# Patient Record
Sex: Female | Born: 1935 | Race: White | Hispanic: No | Smoking: Former smoker
Health system: Southern US, Community
[De-identification: ages and names within clinical notes are randomized; demographics above are authoritative.]

## PROBLEM LIST (undated history)

## (undated) DIAGNOSIS — K439 Ventral hernia without obstruction or gangrene: Secondary | ICD-10-CM

## (undated) DIAGNOSIS — Z860100 Personal history of colon polyps, unspecified: Secondary | ICD-10-CM

## (undated) DIAGNOSIS — I10 Essential (primary) hypertension: Secondary | ICD-10-CM

## (undated) DIAGNOSIS — Z87891 Personal history of nicotine dependence: Secondary | ICD-10-CM

## (undated) DIAGNOSIS — Z1211 Encounter for screening for malignant neoplasm of colon: Secondary | ICD-10-CM

## (undated) DIAGNOSIS — Z853 Personal history of malignant neoplasm of breast: Secondary | ICD-10-CM

## (undated) DIAGNOSIS — C801 Malignant (primary) neoplasm, unspecified: Secondary | ICD-10-CM

## (undated) DIAGNOSIS — Z1239 Encounter for other screening for malignant neoplasm of breast: Secondary | ICD-10-CM

## (undated) DIAGNOSIS — C50419 Malignant neoplasm of upper-outer quadrant of unspecified female breast: Secondary | ICD-10-CM

## (undated) HISTORY — DX: Encounter for other screening for malignant neoplasm of breast: Z12.39

## (undated) HISTORY — DX: Personal history of colonic polyps: Z86.010

## (undated) HISTORY — DX: Personal history of colon polyps, unspecified: Z86.0100

## (undated) HISTORY — DX: Malignant neoplasm of upper-outer quadrant of unspecified female breast: C50.419

## (undated) HISTORY — DX: Personal history of nicotine dependence: Z87.891

## (undated) HISTORY — DX: Malignant (primary) neoplasm, unspecified: C80.1

## (undated) HISTORY — PX: CHOLECYSTECTOMY: SHX55

## (undated) HISTORY — DX: Ventral hernia without obstruction or gangrene: K43.9

## (undated) HISTORY — PX: ABDOMINAL HYSTERECTOMY: SHX81

## (undated) HISTORY — DX: Essential (primary) hypertension: I10

## (undated) HISTORY — DX: Encounter for screening for malignant neoplasm of colon: Z12.11

## (undated) HISTORY — DX: Personal history of malignant neoplasm of breast: Z85.3

## (undated) HISTORY — PX: COLONOSCOPY: SHX174

---

## 1998-02-24 DIAGNOSIS — Z853 Personal history of malignant neoplasm of breast: Secondary | ICD-10-CM

## 1998-02-24 HISTORY — PX: BREAST LUMPECTOMY: SHX2

## 1998-02-24 HISTORY — DX: Personal history of malignant neoplasm of breast: Z85.3

## 1999-01-25 DIAGNOSIS — C801 Malignant (primary) neoplasm, unspecified: Secondary | ICD-10-CM

## 1999-01-25 HISTORY — DX: Malignant (primary) neoplasm, unspecified: C80.1

## 2001-02-24 HISTORY — PX: UPPER GI ENDOSCOPY: SHX6162

## 2001-05-06 DIAGNOSIS — Z860100 Personal history of colon polyps, unspecified: Secondary | ICD-10-CM | POA: Insufficient documentation

## 2004-02-25 HISTORY — PX: OTHER SURGICAL HISTORY: SHX169

## 2004-07-20 ENCOUNTER — Inpatient Hospital Stay: Payer: Self-pay | Admitting: Orthopaedic Surgery

## 2004-07-20 ENCOUNTER — Other Ambulatory Visit: Payer: Self-pay

## 2006-02-24 HISTORY — PX: SKIN CANCER EXCISION: SHX779

## 2006-06-04 ENCOUNTER — Ambulatory Visit: Payer: Self-pay | Admitting: General Surgery

## 2007-02-25 HISTORY — PX: PELVIC EXENTERATION: SHX738

## 2007-02-25 HISTORY — PX: ABDOMINAL AORTIC ANEURYSM REPAIR: SHX42

## 2007-02-25 HISTORY — PX: WRIST SURGERY: SHX841

## 2007-08-09 ENCOUNTER — Other Ambulatory Visit: Payer: Self-pay

## 2007-08-09 ENCOUNTER — Ambulatory Visit: Payer: Self-pay | Admitting: Obstetrics & Gynecology

## 2007-08-19 ENCOUNTER — Ambulatory Visit: Payer: Self-pay | Admitting: Obstetrics & Gynecology

## 2007-11-26 ENCOUNTER — Inpatient Hospital Stay: Payer: Self-pay | Admitting: Vascular Surgery

## 2007-11-26 ENCOUNTER — Other Ambulatory Visit: Payer: Self-pay

## 2007-12-03 ENCOUNTER — Other Ambulatory Visit: Payer: Self-pay

## 2008-02-16 ENCOUNTER — Emergency Department: Payer: Self-pay | Admitting: Internal Medicine

## 2008-02-23 ENCOUNTER — Emergency Department: Payer: Self-pay | Admitting: Emergency Medicine

## 2008-02-24 ENCOUNTER — Inpatient Hospital Stay: Payer: Self-pay | Admitting: Specialist

## 2008-02-25 DIAGNOSIS — K439 Ventral hernia without obstruction or gangrene: Secondary | ICD-10-CM

## 2008-02-25 HISTORY — DX: Ventral hernia without obstruction or gangrene: K43.9

## 2008-02-25 HISTORY — PX: VENTRAL HERNIA REPAIR: SHX424

## 2008-02-28 ENCOUNTER — Encounter: Payer: Self-pay | Admitting: Internal Medicine

## 2008-03-27 ENCOUNTER — Encounter: Payer: Self-pay | Admitting: Internal Medicine

## 2008-04-26 ENCOUNTER — Ambulatory Visit: Payer: Self-pay | Admitting: Orthopedic Surgery

## 2008-05-11 ENCOUNTER — Ambulatory Visit: Payer: Self-pay | Admitting: Family Medicine

## 2008-07-11 ENCOUNTER — Ambulatory Visit: Payer: Self-pay | Admitting: General Surgery

## 2008-07-19 ENCOUNTER — Inpatient Hospital Stay: Payer: Self-pay | Admitting: General Surgery

## 2008-07-19 DIAGNOSIS — K439 Ventral hernia without obstruction or gangrene: Secondary | ICD-10-CM | POA: Insufficient documentation

## 2008-07-19 HISTORY — PX: OOPHORECTOMY: SHX86

## 2008-10-17 ENCOUNTER — Ambulatory Visit: Payer: Self-pay | Admitting: Family Medicine

## 2008-10-25 ENCOUNTER — Ambulatory Visit: Payer: Self-pay | Admitting: Family Medicine

## 2008-10-25 ENCOUNTER — Ambulatory Visit: Payer: Self-pay | Admitting: Internal Medicine

## 2008-11-08 ENCOUNTER — Ambulatory Visit: Payer: Self-pay | Admitting: Family Medicine

## 2008-11-13 ENCOUNTER — Ambulatory Visit: Payer: Self-pay | Admitting: Internal Medicine

## 2008-11-16 ENCOUNTER — Ambulatory Visit: Payer: Self-pay | Admitting: Internal Medicine

## 2008-11-24 ENCOUNTER — Ambulatory Visit: Payer: Self-pay | Admitting: Internal Medicine

## 2009-02-24 ENCOUNTER — Ambulatory Visit: Payer: Self-pay | Admitting: Internal Medicine

## 2009-02-26 ENCOUNTER — Ambulatory Visit: Payer: Self-pay | Admitting: Internal Medicine

## 2009-03-02 ENCOUNTER — Ambulatory Visit: Payer: Self-pay | Admitting: Internal Medicine

## 2009-03-08 ENCOUNTER — Ambulatory Visit: Payer: Self-pay | Admitting: Family Medicine

## 2009-03-19 ENCOUNTER — Ambulatory Visit: Payer: Self-pay | Admitting: Family Medicine

## 2009-03-27 ENCOUNTER — Ambulatory Visit: Payer: Self-pay | Admitting: Internal Medicine

## 2009-10-02 ENCOUNTER — Ambulatory Visit: Payer: Self-pay | Admitting: Family Medicine

## 2009-10-11 ENCOUNTER — Emergency Department: Payer: Self-pay | Admitting: Internal Medicine

## 2009-10-18 ENCOUNTER — Ambulatory Visit: Payer: Self-pay

## 2009-10-25 ENCOUNTER — Ambulatory Visit: Payer: Self-pay | Admitting: Unknown Physician Specialty

## 2009-11-02 ENCOUNTER — Ambulatory Visit: Payer: Self-pay | Admitting: Unknown Physician Specialty

## 2009-11-06 LAB — PATHOLOGY REPORT

## 2009-12-11 IMAGING — CT CT ABD-PELV W/ CM
1 of 2 series · 15 of 32 positions shown, 19 images · non-contrast
Comparison: none

REASON FOR EXAM: abd mass found on exam
COMMENTS:

[Series 2: soft tissue · axial · 0.72mm/px · z∈[+86,+466]mm · 15 of 84 slices shown, 19 images]
[im 4/84  soft-tissue]
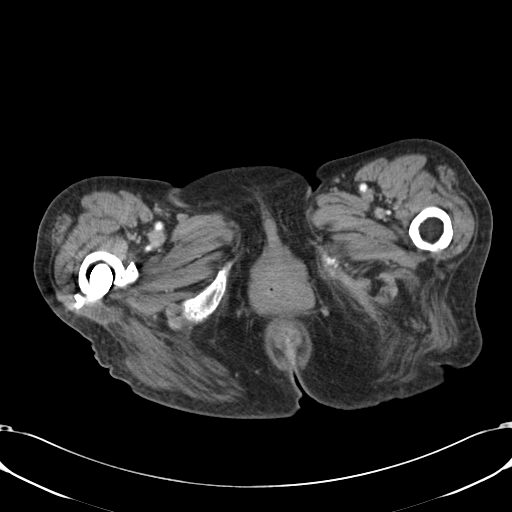
[im 4/84  bone]
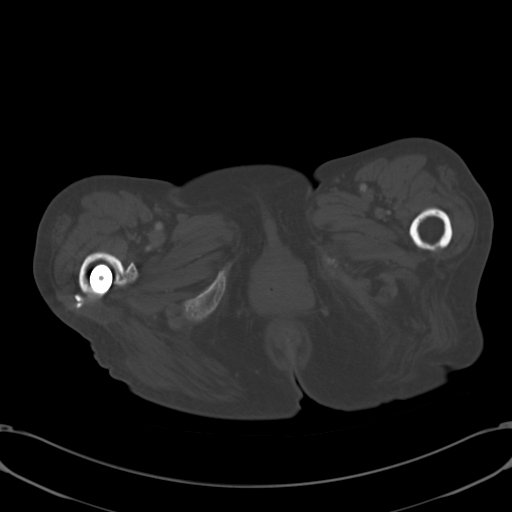
[im 10/84  soft-tissue]
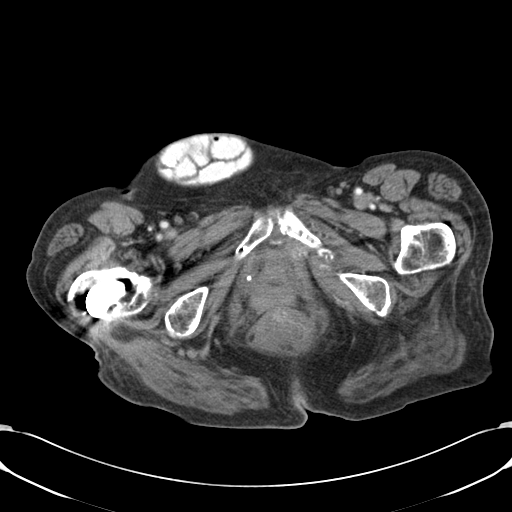
[im 16/84  soft-tissue]
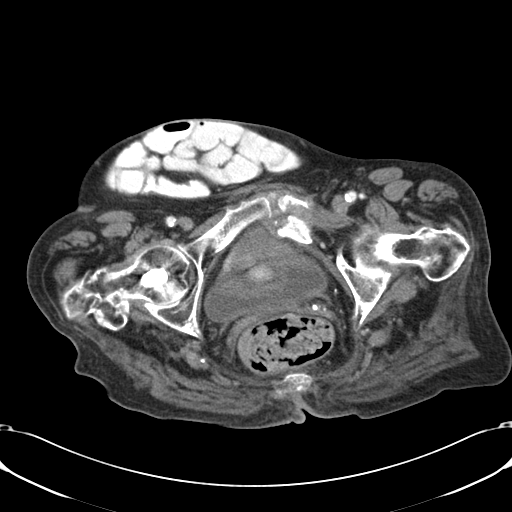
[im 23/84  soft-tissue]
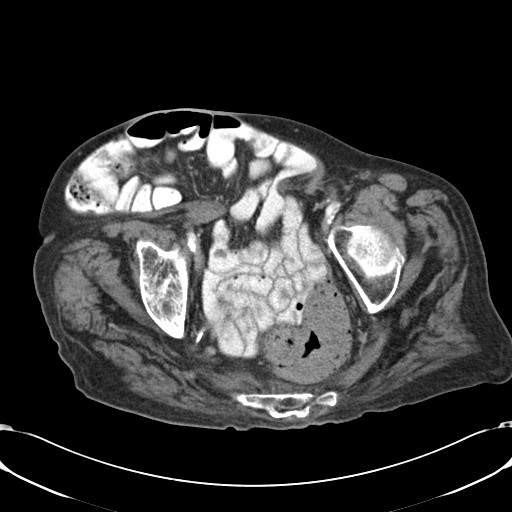
[im 29/84  soft-tissue]
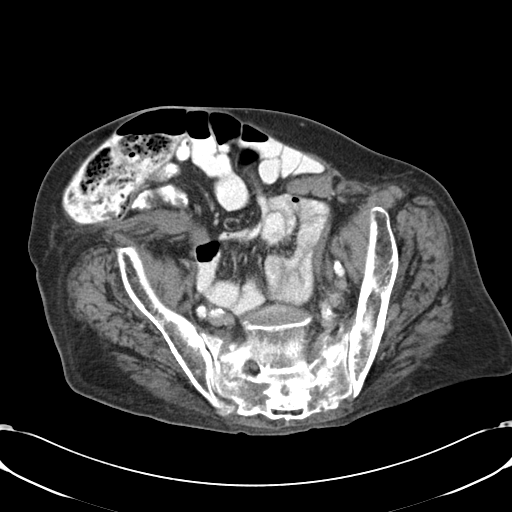
[im 36/84  soft-tissue]
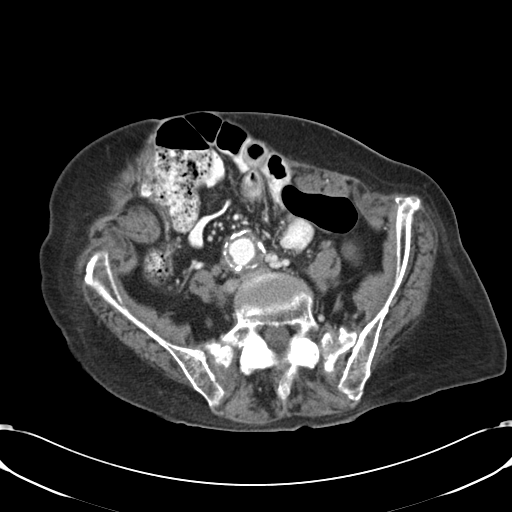
[im 42/84  soft-tissue]
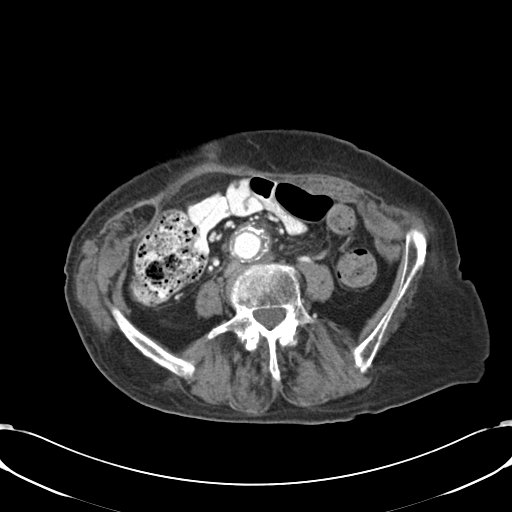
[im 48/84  soft-tissue]
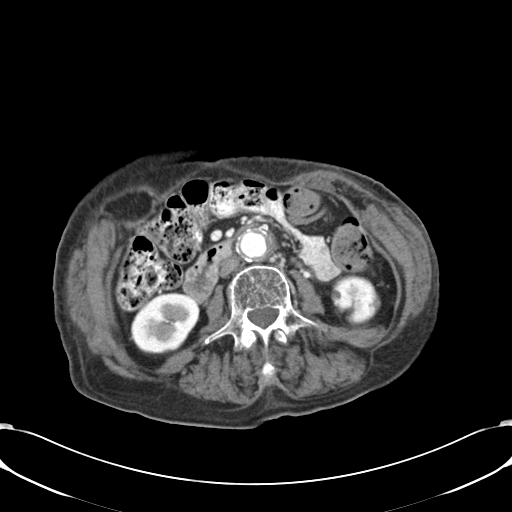
[im 55/84  soft-tissue]
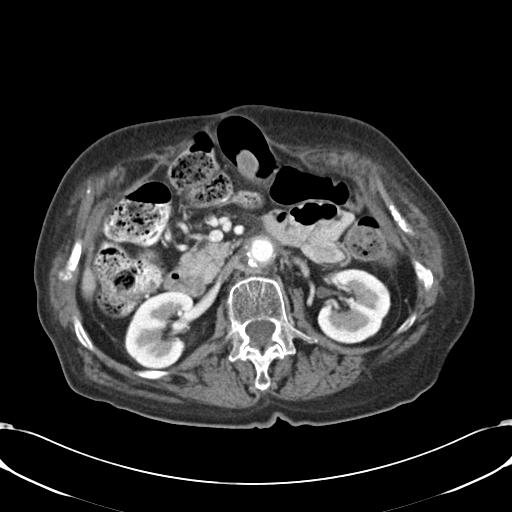
[im 55/84  bone]
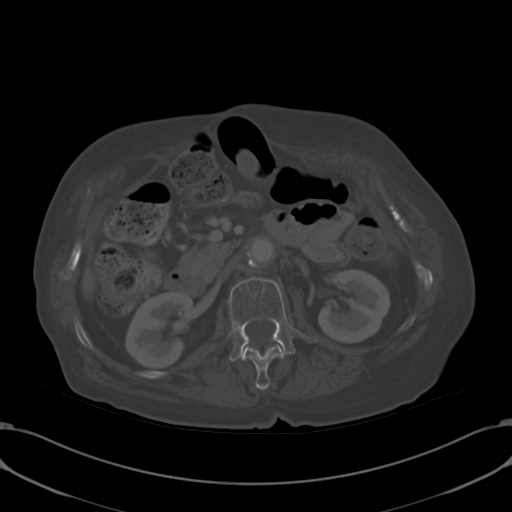
[im 61/84  soft-tissue]
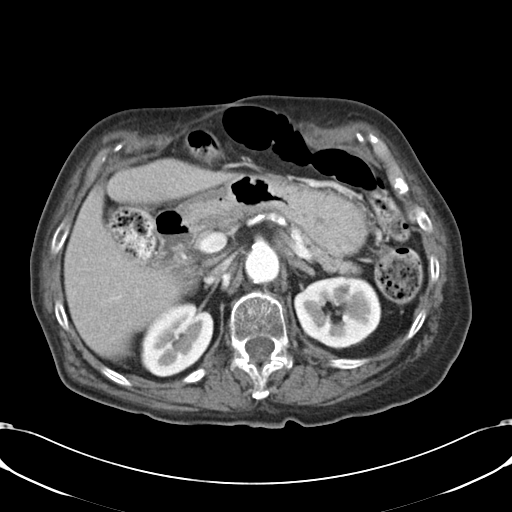
[im 68/84  soft-tissue]
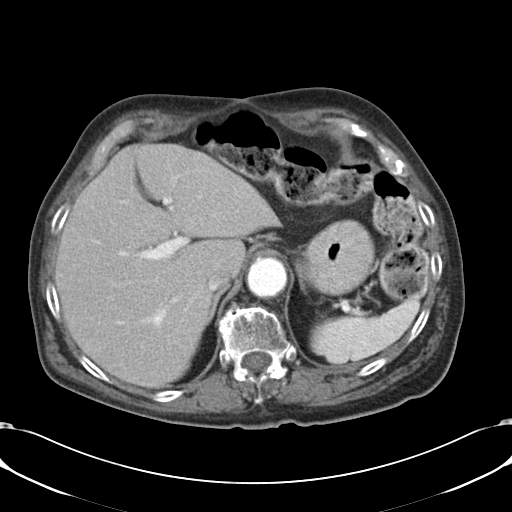
[im 71/84  lung]
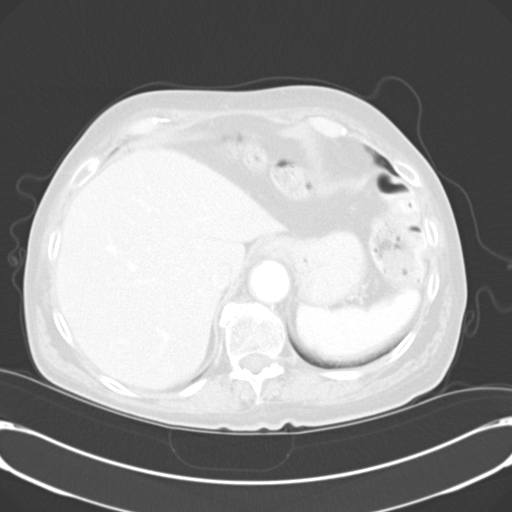
[im 74/84  soft-tissue]
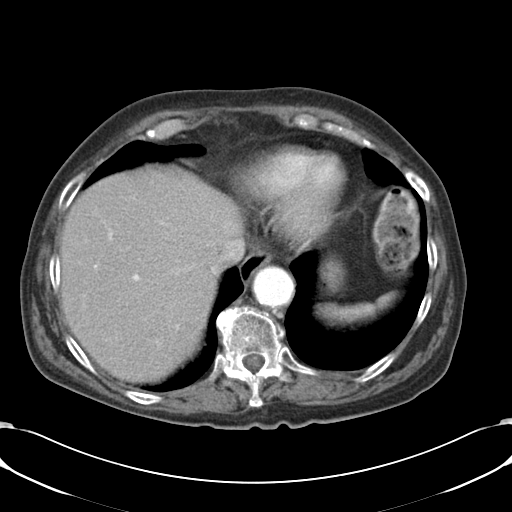
[im 74/84  lung]
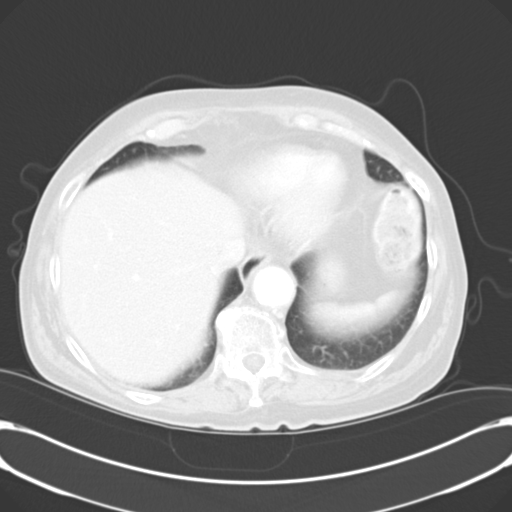
[im 77/84  lung]
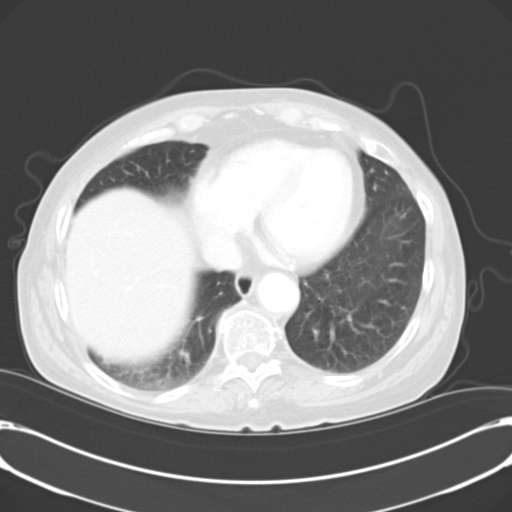
[im 80/84  soft-tissue]
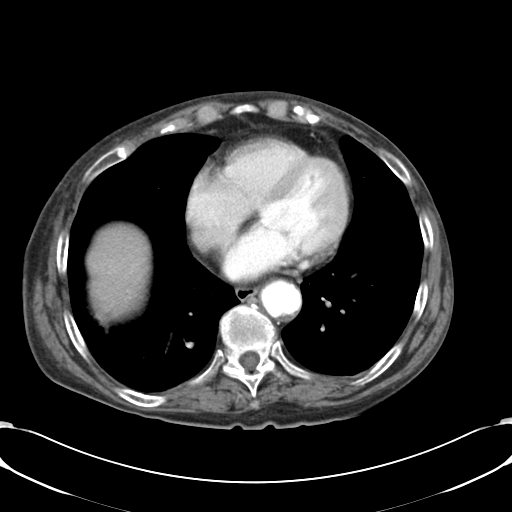
[im 80/84  lung]
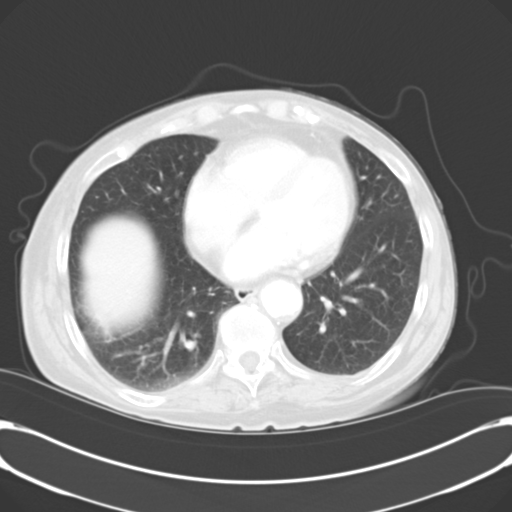

[15 of 32 positions shown; findings below may reference images not displayed]

PROCEDURE:     CT  - CT ABDOMEN / PELVIS  W  - May 11, 2008  [DATE]

RESULT:     CT of the abdomen and pelvis is performed utilizing
approximately 100 ml of [id number] iodinated intravenous contrast along with
oral contrast. Images are reconstructed in the axial plane at 5 mm slice
thickness and compared to the previous examination of 11/26/2007.

Since the previous study, the patient has undergone repair of the large
abdominal aortic aneurysm. There has been development of a large abdominal
hernia containing loops of colon and small bowel in the right and midline
pelvic regions. There is a left pubic fracture in the body of the pubic bone
with an inferior pubic ramus fracture as well. There is evidence of callus
formation consistent with healing. The urinary bladder is not significantly
distended. There is no discrete mass or adenopathy. There is a fatty density
along the right anterolateral chest wall inferior to the lower rib margin
suggestive of a possible fatty mass such as a lipoma showing an oblique
anterior to posterior length of 4.2 cm with a thickness of approximately
cm. This was not seen previously. Low-attenuation areas are seen in the left
kidney suggestive of simple cysts. There is no urinary obstruction. The
liver, spleen, pancreas and adrenal glands appear unremarkable. The lung
bases show mild emphysematous changes. There is some areas of atelectasis or
fibrosis.
IMPRESSION: Interval development of a large anterior abdominal wall hernia which may
account for the palpable area of abnormality. There has also been interval
repair of the previous aortic aneurysm. There has been interval fracture of
the left pubic bone in the area of the symphysis and inferior pubic ramus.

## 2010-10-19 IMAGING — NM NUCLEAR MEDICINE WHOLE BODY BONE SCINTIGRAPHY
2 series · 10 of 10 positions shown · non-contrast
Comparison: none

REASON FOR EXAM: xray showed compression fx at T9  hx breast cancer back
pain with cough
COMMENTS:

[Series 1000: 3 hr wholebody · 2.40mm/px · 2 of 2 frames shown]
[frame 1/2]
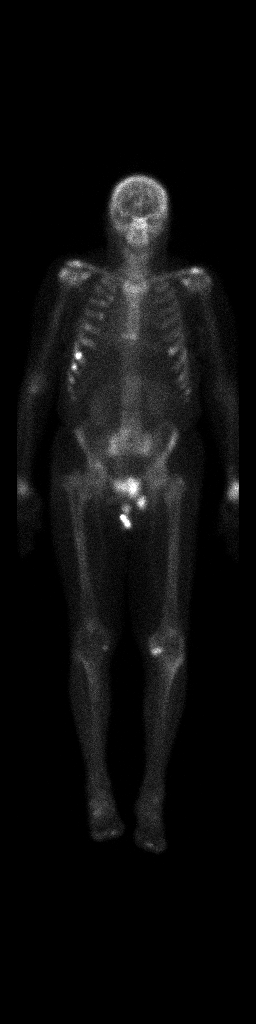
[frame 2/2]
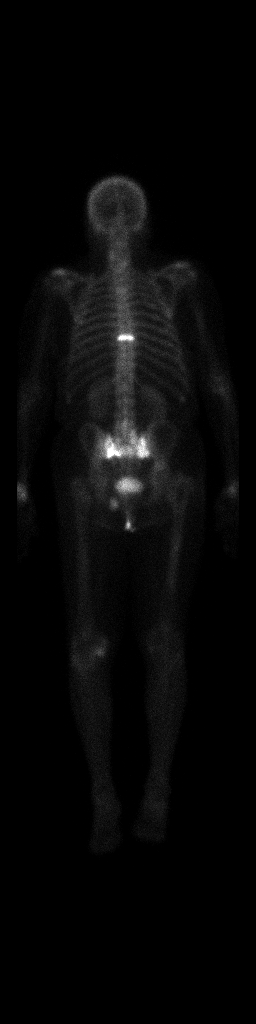

[Series 1000: statics · 2.40mm/px · 4 acquisitions, 8 frames shown]
[im 1/4]
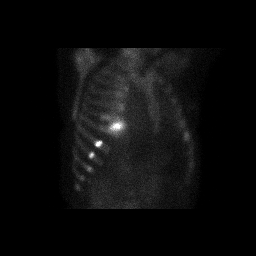
[im 1/4]
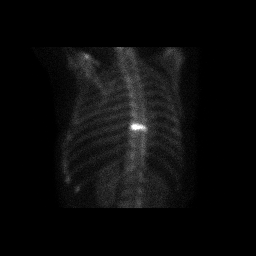
[im 2/4]
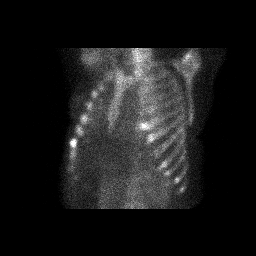
[im 2/4]
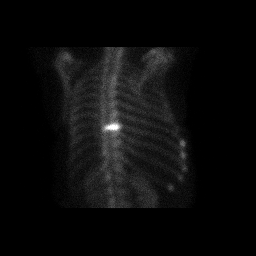
[im 3/4]
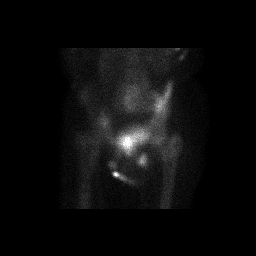
[im 3/4]
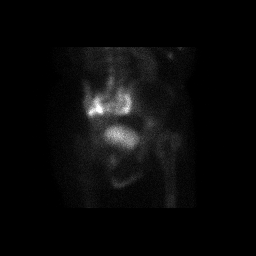
[im 4/4]
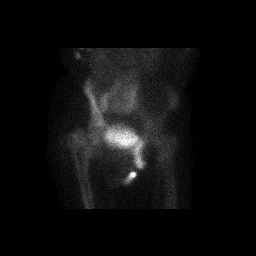
[im 4/4]
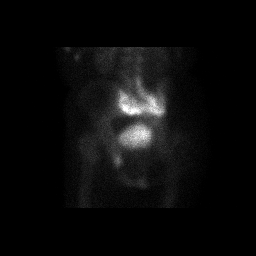

[10 of 10 positions shown; findings below may reference images not displayed]

PROCEDURE:     NM  - NM BONE WB 3 HR [DATE]  [DATE]

RESULT:     Following intravenous administration of 23.56 mCi Technetium 99m
MDP, Total Body Bone Scan was performed. The current exam is compared to the
prior exam of 10/25/2008.

There are again noted multiple foci of increased tracer activity involving
the anterior ribs bilaterally and basically stable or less prominent than on
the prior exam. There is increased tracer activity at the sacrum, similar to
that observed on the prior exam. Also noted is a focal abnormal area of
increased tracer activity involving the left inferior pubic bone and which
also is stable in appearance as compared to the prior exam.

Since the previous exam, there has developed a focal abnormal area of
increased tracer activity in the thoracic spine at approximately T8 and a
very slight focal area of increased tracer activity is noted in the thoracic
spine at approximately T3. These foci could either be secondary to
metastatic disease or could represent post traumatic change.
IMPRESSION: 1.  There is an intense focal area of increased tracer activity in the
thoracic spine at approximately T8 and a slight increase in tracer activity
in the thoracic spine at approximately T3. Metastatic disease or post
traumatic change are both considerations in the differential.
2.  No additional new foci of increased tracer activity are seen as compared
to the prior exam of 10/25/2008.
[DATE].  Bilateral foci of increased tracer activity are observed in the ribs and
appear stable or slightly less intense than was evident on the prior exam.
4.  Increased tracer activity is again noted at the sacrum bilaterally and a
stable focus of increased tracer activity is again noted at the left
inferior pubic ramus.
5.  Tracer activity is present in both kidneys.

## 2010-11-07 ENCOUNTER — Ambulatory Visit: Payer: Self-pay | Admitting: Family Medicine

## 2011-02-25 HISTORY — PX: EYE SURGERY: SHX253

## 2011-04-07 ENCOUNTER — Ambulatory Visit: Payer: Self-pay | Admitting: Ophthalmology

## 2011-04-15 ENCOUNTER — Ambulatory Visit: Payer: Self-pay | Admitting: Ophthalmology

## 2011-06-03 ENCOUNTER — Ambulatory Visit: Payer: Self-pay | Admitting: Ophthalmology

## 2012-05-06 ENCOUNTER — Ambulatory Visit: Payer: Self-pay | Admitting: Orthopedic Surgery

## 2012-05-10 ENCOUNTER — Encounter: Payer: Self-pay | Admitting: General Surgery

## 2012-06-21 ENCOUNTER — Encounter: Payer: Self-pay | Admitting: *Deleted

## 2012-07-01 ENCOUNTER — Encounter: Payer: Self-pay | Admitting: General Surgery

## 2012-07-14 ENCOUNTER — Ambulatory Visit (INDEPENDENT_AMBULATORY_CARE_PROVIDER_SITE_OTHER): Payer: Medicare Other | Admitting: General Surgery

## 2012-07-14 ENCOUNTER — Other Ambulatory Visit: Payer: Self-pay | Admitting: *Deleted

## 2012-07-14 ENCOUNTER — Encounter: Payer: Self-pay | Admitting: General Surgery

## 2012-07-14 VITALS — BP 132/66 | HR 72 | Resp 16 | Ht 62.0 in | Wt 129.0 lb

## 2012-07-14 DIAGNOSIS — C50911 Malignant neoplasm of unspecified site of right female breast: Secondary | ICD-10-CM

## 2012-07-14 DIAGNOSIS — K439 Ventral hernia without obstruction or gangrene: Secondary | ICD-10-CM

## 2012-07-14 DIAGNOSIS — Z853 Personal history of malignant neoplasm of breast: Secondary | ICD-10-CM

## 2012-07-14 NOTE — Progress Notes (Signed)
The patient has been asked to return to the office in one year for a bilateral diagnostic mammogram. 

## 2012-07-14 NOTE — Progress Notes (Signed)
Patient ID: Sandra Duke, female   DOB: Apr 13, 1935, 77 y.o.   MRN: [id number]  Chief Complaint  Patient presents with  . Follow-up    mammogram    HPI Sandra Duke is a 77 y.o. female who presents for a follow up mammogram. The most recent mammogram was done on 07/01/12 at [facility] with a birad category 2. The patient denies regular self breast checks but does get regular mammograms done. The patient has a history of right breast cancer diagnosed in 2000. She underwent right breast lumpectomy in 2000 and radiation therapy. The patient denies any new problems with the breast at this time. The only known family history of breast problems are on her paternal side of the family with grandmother and aunt.   The patient underwent repair of a ventral hernia with onlay Prolite mesh as well as right oophorectomy on Jul 19, 2008. Except for some stable asymmetry in the abdominal wall she has not noticed new problems.  HPI  Past Medical History  Diagnosis Date  . Cancer 01/1999    right bst, lumpectomy,sn biopay, xray dissection as part of [facility] sentinel node study T1,N0,M0, ER/PR positive HER-2/neu not overexpressing infiltrating ductal carcinoma. 5 yrs of tamoxifen therapy completed in 2006   . Hypertension   . Ventral hernia, unspecified, without mention of obstruction or gangrene 2010  . Personal history of tobacco use, presenting hazards to health   . Personal history of malignant neoplasm of breast 2000  . Breast screening, unspecified   . Special screening for malignant neoplasms, colon   . Malignant neoplasm of upper-outer quadrant of female breast   . Personal history of colonic polyps     Past Surgical History  Procedure Laterality Date  . Abdominal hysterectomy      age of 79  . Colonoscopy  [id number]    adenomatous polyp removed; rectal polyp(2008), hyperplastic changes  . Upper gi endoscopy  2003    bx distal esophagus-squamous mucosa with mild reactive changes consistent with  gastroesophageal reflux. No columnar mucosa present.  . Ventral hernia repair  2010    with onlay prolite mesh  . Pelvic exenteration  2009    Broken pelvis  . Wrist surgery  2009    left  . Abdominal aortic aneurysm repair  2009  . Skin cancer excision  2008  . Broken hip  2006  . Breast lumpectomy Right 2000  . Oophorectomy Right 07/19/2008  . Cholecystectomy    . Eye surgery Bilateral 2013    History reviewed. No pertinent family history.  Social History History  Substance Use Topics  . Smoking status: Former Smoker -- 15 years    Types: Cigarettes  . Smokeless tobacco: Not on file     Comment: quit in 2006  . Alcohol Use: No    Allergies  Allergen Reactions  . Sulfa Antibiotics Anaphylaxis  . Macrobid (Nitrofurantoin Macrocrystal) Other (See Comments)    burning  . Naprosyn (Naproxen) Other (See Comments)    burning    Current Outpatient Prescriptions  Medication Sig Dispense Refill  . alendronate (FOSAMAX) 70 MG tablet Take 70 mg by mouth. Take with a full glass of water on an empty stomach.      Marland Kitchen amLODipine (NORVASC) 5 MG tablet Take 5 mg by mouth daily.      . cyclobenzaprine (FLEXERIL) 10 MG tablet Take 10 mg by mouth daily.      Marland Kitchen lisinopril (PRINIVIL,ZESTRIL) 20 MG tablet Take 20 mg by mouth  daily.      . metoprolol succinate (TOPROL-XL) 25 MG 24 hr tablet Take 25 mg by mouth daily.      . Omeprazole (PRILOSEC PO) Take by mouth.       No current facility-administered medications for this visit.    Review of Systems Review of Systems  Constitutional: Negative.   Respiratory: Negative.   Cardiovascular: Negative.     Blood pressure 132/66, pulse 72, resp. rate 16, height 5\' 2"  (1.575 m), weight 129 lb (58.514 kg).  Physical Exam Physical Exam  Constitutional: She appears well-developed and well-nourished.  Neck: Trachea normal. No mass and no thyromegaly present.  Cardiovascular: Normal rate, regular rhythm, normal heart sounds and normal pulses.    No murmur heard. Pulmonary/Chest: Effort normal. She has rhonchi in the right lower field and the left lower field. Right breast exhibits no inverted nipple, no mass, no nipple discharge, no skin change and no tenderness. Left breast exhibits no inverted nipple, no mass, no nipple discharge, no skin change and no tenderness.  Left breast well healed scar at 9 o'clock. Left axilla well healed scar.   Abdominal: Soft. Normal appearance and bowel sounds are normal. There is no hepatosplenomegaly. There is no tenderness. No hernia.  Well healed ventral hernia scar.   Lymphadenopathy:    She has no cervical adenopathy.    She has no axillary adenopathy.  The onlay mesh repair of the anterior abdominal wall appears intact. No weakness in the midline is appreciated. No tenderness.  Data Reviewed Bilateral mammogram dated Jul 01, 2012 completed at [facility] showed no interval change. BI-RAD-2.  Assessment    #1doing well now 14 years status post right breast cancer.  #2 no evidence of recurrent ventral hernia  #3 due for screening colonoscopy secondary to previous polyps.    Plan    The patient reports she has an upcoming appointment with the cardiology service. Once she is cleared she has been asked to call the office to schedule an elective followup colonoscopy.        Sandra Duke 07/14/2012, 7:29 PM

## 2012-07-14 NOTE — Patient Instructions (Signed)
Patient to return in 1 year with bilateral diagnostic mammogram at [facility].

## 2012-08-03 ENCOUNTER — Encounter: Payer: Self-pay | Admitting: General Surgery

## 2012-08-16 ENCOUNTER — Other Ambulatory Visit: Payer: Self-pay | Admitting: *Deleted

## 2012-08-16 ENCOUNTER — Telehealth: Payer: Self-pay | Admitting: *Deleted

## 2012-08-16 DIAGNOSIS — Z1211 Encounter for screening for malignant neoplasm of colon: Secondary | ICD-10-CM

## 2012-08-16 MED ORDER — POLYETHYLENE GLYCOL 3350 17 GM/SCOOP PO POWD: ORAL | Status: DC

## 2012-08-16 NOTE — Telephone Encounter (Signed)
Patient was contacted today since we received cardiac clearance from Dr. Darrold Junker. This patient has been scheduled for a colonoscopy on 09-15-12 at [facility]. She reports no medication changes since last office visit. Paperwork has been mailed to her and she was instructed to call if she has further questions.

## 2012-08-16 NOTE — Progress Notes (Signed)
Miralax prescription was sent to patient's pharmacy.

## 2012-09-07 ENCOUNTER — Other Ambulatory Visit: Payer: Self-pay | Admitting: General Surgery

## 2012-09-07 DIAGNOSIS — Z860100 Personal history of colon polyps, unspecified: Secondary | ICD-10-CM

## 2012-09-09 ENCOUNTER — Telehealth: Payer: Self-pay | Admitting: *Deleted

## 2012-09-09 NOTE — Telephone Encounter (Signed)
Message left for patient to call the office.  We need to verify that patient has had no medication changes since last office visit. She is scheduled for a colonoscopy on 09-15-12 at [facility].

## 2012-09-10 ENCOUNTER — Telehealth: Payer: Self-pay | Admitting: *Deleted

## 2012-09-10 NOTE — Telephone Encounter (Signed)
Patient reports no change in medications. She reports she has already pre-registered and has her Miralax prescription. She will call the office if she has further questions. We will proceed with colonoscopy that is scheduled for 09-15-12.

## 2012-09-15 ENCOUNTER — Ambulatory Visit: Payer: Self-pay | Admitting: General Surgery

## 2012-09-15 DIAGNOSIS — D129 Benign neoplasm of anus and anal canal: Secondary | ICD-10-CM

## 2012-09-15 DIAGNOSIS — D128 Benign neoplasm of rectum: Secondary | ICD-10-CM

## 2012-09-20 ENCOUNTER — Encounter: Payer: Self-pay | Admitting: General Surgery

## 2012-09-21 ENCOUNTER — Telehealth: Payer: Self-pay

## 2012-09-21 NOTE — Telephone Encounter (Signed)
Message copied by Sinda Du on Tue Sep 21, 2012  9:58 AM ------      Message from: [place], Merrily Pew      Created: Tue Sep 21, 2012  6:52 AM       Please notify the patient with the polyps removed at the time of her recent colonoscopy were benign. Please have her placed in recalls for a followup exam in 5 years.      ----- Message -----         From: Jena Gauss, CMA         Sent: 09/20/2012  11:29 AM           To: Earline Mayotte, MD                   ------

## 2012-09-21 NOTE — Telephone Encounter (Signed)
Spoke with patient and notified her of the benign findings with her polyps that were removed. I let the patient know that a repeat exam in 5 years was recommended.  Patient is very pleased. Placed in recalls for repeat exam in 5 years.

## 2012-09-22 ENCOUNTER — Encounter: Payer: Self-pay | Admitting: General Surgery

## 2013-06-13 ENCOUNTER — Emergency Department: Payer: Self-pay | Admitting: Emergency Medicine

## 2013-06-13 LAB — COMPREHENSIVE METABOLIC PANEL
ALK PHOS: 63 U/L
ANION GAP: 6 — AB (ref 7–16)
AST: 21 U/L (ref 15–37)
Albumin: 3.7 g/dL (ref 3.4–5.0)
BILIRUBIN TOTAL: 0.5 mg/dL (ref 0.2–1.0)
BUN: 7 mg/dL (ref 7–18)
CHLORIDE: 96 mmol/L — AB (ref 98–107)
Calcium, Total: 8.8 mg/dL (ref 8.5–10.1)
Co2: 27 mmol/L (ref 21–32)
Creatinine: 0.58 mg/dL — ABNORMAL LOW (ref 0.60–1.30)
EGFR (African American): >60
GLUCOSE: 107 mg/dL — AB (ref 65–99)
OSMOLALITY: 257 (ref 275–301)
POTASSIUM: 4 mmol/L (ref 3.5–5.1)
SGPT (ALT): 16 U/L (ref 12–78)
Sodium: 129 mmol/L — ABNORMAL LOW (ref 136–145)
Total Protein: 7.5 g/dL (ref 6.4–8.2)

## 2013-06-13 LAB — CBC
HCT: 48.2 % — ABNORMAL HIGH (ref 35.0–47.0)
HGB: 15.9 g/dL (ref 12.0–16.0)
MCH: 31.6 pg (ref 26.0–34.0)
MCHC: 33.1 g/dL (ref 32.0–36.0)
MCV: 96 fL (ref 80–100)
PLATELETS: 261 10*3/uL (ref 150–440)
RBC: 5.05 10*6/uL (ref 3.80–5.20)
RDW: 13.7 % (ref 11.5–14.5)
WBC: 6.9 10*3/uL (ref 3.6–11.0)

## 2013-06-13 LAB — TROPONIN I: Troponin-I: <0.02 ng/mL

## 2013-07-11 ENCOUNTER — Inpatient Hospital Stay: Payer: Self-pay | Admitting: Internal Medicine

## 2013-07-11 LAB — COMPREHENSIVE METABOLIC PANEL
ALK PHOS: 57 U/L
ALT: 19 U/L (ref 12–78)
ANION GAP: 8 (ref 7–16)
Albumin: 3.5 g/dL (ref 3.4–5.0)
BUN: 11 mg/dL (ref 7–18)
Bilirubin,Total: 0.8 mg/dL (ref 0.2–1.0)
CHLORIDE: 77 mmol/L — AB (ref 98–107)
Calcium, Total: 9 mg/dL (ref 8.5–10.1)
Co2: 32 mmol/L (ref 21–32)
Creatinine: 0.49 mg/dL — ABNORMAL LOW (ref 0.60–1.30)
EGFR (African American): >60
EGFR (Non-African Amer.): >60
GLUCOSE: 103 mg/dL — AB (ref 65–99)
Osmolality: 236 (ref 275–301)
Potassium: 3.2 mmol/L — ABNORMAL LOW (ref 3.5–5.1)
SGOT(AST): 17 U/L (ref 15–37)
SODIUM: 117 mmol/L — AB (ref 136–145)
TOTAL PROTEIN: 7.1 g/dL (ref 6.4–8.2)

## 2013-07-11 LAB — URINALYSIS, COMPLETE
BACTERIA: NONE SEEN
Bilirubin,UR: NEGATIVE
Glucose,UR: NEGATIVE mg/dL (ref 0–75)
KETONE: NEGATIVE
Nitrite: NEGATIVE
Ph: 7 (ref 4.5–8.0)
Protein: 30
RBC,UR: 13 /HPF (ref 0–5)
Specific Gravity: 1.004 (ref 1.003–1.030)
Squamous Epithelial: 1
WBC UR: 234 /HPF (ref 0–5)

## 2013-07-11 LAB — MAGNESIUM: MAGNESIUM: 1.4 mg/dL — AB

## 2013-07-11 LAB — PRO B NATRIURETIC PEPTIDE: B-Type Natriuretic Peptide: 1810 pg/mL — ABNORMAL HIGH (ref 0–450)

## 2013-07-11 LAB — CBC
HCT: 39.1 % (ref 35.0–47.0)
HGB: 14 g/dL (ref 12.0–16.0)
MCH: 33.1 pg (ref 26.0–34.0)
MCHC: 35.9 g/dL (ref 32.0–36.0)
MCV: 92 fL (ref 80–100)
Platelet: 257 10*3/uL (ref 150–440)
RBC: 4.23 10*6/uL (ref 3.80–5.20)
RDW: 13.5 % (ref 11.5–14.5)
WBC: 7.8 10*3/uL (ref 3.6–11.0)

## 2013-07-11 LAB — HEMOGLOBIN A1C: Hemoglobin A1C: 6.3 % (ref 4.2–6.3)

## 2013-07-11 LAB — TROPONIN I: Troponin-I: <0.02 ng/mL

## 2013-07-11 LAB — TSH: Thyroid Stimulating Horm: 1.07 u[IU]/mL

## 2013-07-12 ENCOUNTER — Ambulatory Visit: Payer: Medicare Other | Admitting: General Surgery

## 2013-07-12 LAB — CBC WITH DIFFERENTIAL/PLATELET
BASOS ABS: 0 10*3/uL (ref 0.0–0.1)
Basophil %: 0.7 %
EOS ABS: 0 10*3/uL (ref 0.0–0.7)
Eosinophil %: 0.8 %
HCT: 35.3 % (ref 35.0–47.0)
HGB: 12.4 g/dL (ref 12.0–16.0)
Lymphocyte #: 0.9 10*3/uL — ABNORMAL LOW (ref 1.0–3.6)
Lymphocyte %: 24.2 %
MCH: 32.7 pg (ref 26.0–34.0)
MCHC: 35.1 g/dL (ref 32.0–36.0)
MCV: 93 fL (ref 80–100)
MONO ABS: 0.5 x10 3/mm (ref 0.2–0.9)
Monocyte %: 11.9 %
NEUTROS ABS: 2.4 10*3/uL (ref 1.4–6.5)
NEUTROS PCT: 62.4 %
PLATELETS: 234 10*3/uL (ref 150–440)
RBC: 3.78 10*6/uL — AB (ref 3.80–5.20)
RDW: 14.2 % (ref 11.5–14.5)
WBC: 3.9 10*3/uL (ref 3.6–11.0)

## 2013-07-12 LAB — BASIC METABOLIC PANEL
Anion Gap: 7 (ref 7–16)
BUN: 8 mg/dL (ref 7–18)
CHLORIDE: 90 mmol/L — AB (ref 98–107)
CO2: 29 mmol/L (ref 21–32)
Calcium, Total: 8.4 mg/dL — ABNORMAL LOW (ref 8.5–10.1)
Creatinine: 0.72 mg/dL (ref 0.60–1.30)
GLUCOSE: 89 mg/dL (ref 65–99)
Osmolality: 251 (ref 275–301)
Potassium: 3.6 mmol/L (ref 3.5–5.1)
Sodium: 126 mmol/L — ABNORMAL LOW (ref 136–145)

## 2013-07-12 LAB — MAGNESIUM: MAGNESIUM: 1.5 mg/dL — AB

## 2013-07-13 LAB — BASIC METABOLIC PANEL
ANION GAP: 5 — AB (ref 7–16)
BUN: 7 mg/dL (ref 7–18)
CREATININE: 0.77 mg/dL (ref 0.60–1.30)
Calcium, Total: 8.1 mg/dL — ABNORMAL LOW (ref 8.5–10.1)
Chloride: 96 mmol/L — ABNORMAL LOW (ref 98–107)
Co2: 27 mmol/L (ref 21–32)
EGFR (Non-African Amer.): >60
Glucose: 93 mg/dL (ref 65–99)
Osmolality: 255 (ref 275–301)
Potassium: 3.8 mmol/L (ref 3.5–5.1)
Sodium: 128 mmol/L — ABNORMAL LOW (ref 136–145)

## 2013-07-13 LAB — MAGNESIUM: MAGNESIUM: 2.1 mg/dL

## 2013-07-13 LAB — URINE CULTURE

## 2013-08-21 ENCOUNTER — Observation Stay: Payer: Self-pay | Admitting: Surgery

## 2013-08-21 LAB — COMPREHENSIVE METABOLIC PANEL
ALK PHOS: 84 U/L
AST: 37 U/L (ref 15–37)
Albumin: 3.5 g/dL (ref 3.4–5.0)
Anion Gap: 8 (ref 7–16)
BUN: 5 mg/dL — ABNORMAL LOW (ref 7–18)
Bilirubin,Total: 0.5 mg/dL (ref 0.2–1.0)
CHLORIDE: 99 mmol/L (ref 98–107)
Calcium, Total: 8.7 mg/dL (ref 8.5–10.1)
Co2: 27 mmol/L (ref 21–32)
Creatinine: 0.71 mg/dL (ref 0.60–1.30)
EGFR (Non-African Amer.): >60
Glucose: 101 mg/dL — ABNORMAL HIGH (ref 65–99)
Osmolality: 266 (ref 275–301)
Potassium: 4.2 mmol/L (ref 3.5–5.1)
SGPT (ALT): 32 U/L (ref 12–78)
Sodium: 134 mmol/L — ABNORMAL LOW (ref 136–145)
Total Protein: 6.9 g/dL (ref 6.4–8.2)

## 2013-08-21 LAB — URINALYSIS, COMPLETE
BLOOD: NEGATIVE
Bacteria: NONE SEEN
Bilirubin,UR: NEGATIVE
Glucose,UR: NEGATIVE mg/dL (ref 0–75)
Ketone: NEGATIVE
LEUKOCYTE ESTERASE: NEGATIVE
Nitrite: NEGATIVE
PROTEIN: NEGATIVE
Ph: 7 (ref 4.5–8.0)
RBC, UR: NONE SEEN /HPF (ref 0–5)
SPECIFIC GRAVITY: 1.002 (ref 1.003–1.030)
Squamous Epithelial: 1
WBC UR: 1 /HPF (ref 0–5)

## 2013-08-21 LAB — CBC
HCT: 44.2 % (ref 35.0–47.0)
HGB: 14.9 g/dL (ref 12.0–16.0)
MCH: 32.7 pg (ref 26.0–34.0)
MCHC: 33.7 g/dL (ref 32.0–36.0)
MCV: 97 fL (ref 80–100)
PLATELETS: 307 10*3/uL (ref 150–440)
RBC: 4.55 10*6/uL (ref 3.80–5.20)
RDW: 15 % — ABNORMAL HIGH (ref 11.5–14.5)
WBC: 5.8 10*3/uL (ref 3.6–11.0)

## 2013-08-21 LAB — PROTIME-INR
INR: 1.2
Prothrombin Time: 14.7 secs (ref 11.5–14.7)

## 2013-08-21 LAB — TROPONIN I
Troponin-I: <0.02 ng/mL
Troponin-I: <0.02 ng/mL
Troponin-I: <0.02 ng/mL

## 2013-08-22 ENCOUNTER — Ambulatory Visit: Payer: Self-pay | Admitting: Neurology

## 2013-08-22 LAB — CBC WITH DIFFERENTIAL/PLATELET
Basophil #: 0 10*3/uL (ref 0.0–0.1)
Basophil %: 0.8 %
EOS ABS: 0.1 10*3/uL (ref 0.0–0.7)
Eosinophil %: 2.3 %
HCT: 42.4 % (ref 35.0–47.0)
HGB: 14 g/dL (ref 12.0–16.0)
LYMPHS PCT: 33 %
Lymphocyte #: 1.7 10*3/uL (ref 1.0–3.6)
MCH: 32.3 pg (ref 26.0–34.0)
MCHC: 33.1 g/dL (ref 32.0–36.0)
MCV: 98 fL (ref 80–100)
Monocyte #: 0.7 x10 3/mm (ref 0.2–0.9)
Monocyte %: 13.1 %
NEUTROS ABS: 2.7 10*3/uL (ref 1.4–6.5)
NEUTROS PCT: 50.8 %
PLATELETS: 300 10*3/uL (ref 150–440)
RBC: 4.35 10*6/uL (ref 3.80–5.20)
RDW: 14.7 % — ABNORMAL HIGH (ref 11.5–14.5)
WBC: 5.3 10*3/uL (ref 3.6–11.0)

## 2013-08-22 LAB — BASIC METABOLIC PANEL
Anion Gap: 6 — ABNORMAL LOW (ref 7–16)
BUN: 7 mg/dL (ref 7–18)
CALCIUM: 8.2 mg/dL — AB (ref 8.5–10.1)
CHLORIDE: 103 mmol/L (ref 98–107)
CO2: 26 mmol/L (ref 21–32)
CREATININE: 0.77 mg/dL (ref 0.60–1.30)
EGFR (African American): >60
EGFR (Non-African Amer.): >60
Glucose: 90 mg/dL (ref 65–99)
Osmolality: 268 (ref 275–301)
POTASSIUM: 3.8 mmol/L (ref 3.5–5.1)
Sodium: 135 mmol/L — ABNORMAL LOW (ref 136–145)

## 2013-10-10 ENCOUNTER — Ambulatory Visit: Payer: Self-pay | Admitting: Internal Medicine

## 2013-10-29 ENCOUNTER — Inpatient Hospital Stay: Payer: Self-pay | Admitting: Internal Medicine

## 2013-10-29 LAB — COMPREHENSIVE METABOLIC PANEL
ALBUMIN: 3.5 g/dL (ref 3.4–5.0)
ANION GAP: 11 (ref 7–16)
Alkaline Phosphatase: 50 U/L
BILIRUBIN TOTAL: 0.8 mg/dL (ref 0.2–1.0)
BUN: 13 mg/dL (ref 7–18)
CALCIUM: 8.6 mg/dL (ref 8.5–10.1)
CREATININE: 0.7 mg/dL (ref 0.60–1.30)
Chloride: 86 mmol/L — ABNORMAL LOW (ref 98–107)
Co2: 25 mmol/L (ref 21–32)
EGFR (Non-African Amer.): >60
Glucose: 107 mg/dL — ABNORMAL HIGH (ref 65–99)
OSMOLALITY: 247 (ref 275–301)
POTASSIUM: 3.7 mmol/L (ref 3.5–5.1)
SGOT(AST): 20 U/L (ref 15–37)
SGPT (ALT): 16 U/L
Sodium: 122 mmol/L — ABNORMAL LOW (ref 136–145)
TOTAL PROTEIN: 6.8 g/dL (ref 6.4–8.2)

## 2013-10-29 LAB — URINALYSIS, COMPLETE
BILIRUBIN, UR: NEGATIVE
BLOOD: NEGATIVE
Bacteria: NONE SEEN
Glucose,UR: NEGATIVE mg/dL (ref 0–75)
Ketone: NEGATIVE
Leukocyte Esterase: NEGATIVE
Nitrite: NEGATIVE
PH: 7 (ref 4.5–8.0)
PROTEIN: NEGATIVE
RBC,UR: 2 /HPF (ref 0–5)
SQUAMOUS EPITHELIAL: NONE SEEN
Specific Gravity: 1.006 (ref 1.003–1.030)

## 2013-10-29 LAB — CBC
HCT: 45.7 % (ref 35.0–47.0)
HGB: 15.9 g/dL (ref 12.0–16.0)
MCH: 32.7 pg (ref 26.0–34.0)
MCHC: 34.8 g/dL (ref 32.0–36.0)
MCV: 94 fL (ref 80–100)
PLATELETS: 249 10*3/uL (ref 150–440)
RBC: 4.87 10*6/uL (ref 3.80–5.20)
RDW: 14.7 % — ABNORMAL HIGH (ref 11.5–14.5)
WBC: 5.6 10*3/uL (ref 3.6–11.0)

## 2013-10-29 LAB — OSMOLALITY, URINE: OSMOLALITY: 237 mosm/kg

## 2013-10-29 LAB — TROPONIN I: TROPONIN-I: 0.02 ng/mL

## 2013-10-29 LAB — AMMONIA

## 2013-10-29 LAB — DIGOXIN LEVEL

## 2013-10-29 LAB — OSMOLALITY: Osmolality: 249 mOsm/kg — CL (ref 280–301)

## 2013-10-30 LAB — CBC WITH DIFFERENTIAL/PLATELET
Basophil #: 0 10*3/uL (ref 0.0–0.1)
Basophil %: 1 %
EOS ABS: 0.1 10*3/uL (ref 0.0–0.7)
Eosinophil %: 1.5 %
HCT: 43.2 % (ref 35.0–47.0)
HGB: 15.2 g/dL (ref 12.0–16.0)
Lymphocyte #: 2 10*3/uL (ref 1.0–3.6)
Lymphocyte %: 41.5 %
MCH: 33.2 pg (ref 26.0–34.0)
MCHC: 35.2 g/dL (ref 32.0–36.0)
MCV: 94 fL (ref 80–100)
MONO ABS: 0.8 x10 3/mm (ref 0.2–0.9)
Monocyte %: 15.8 %
NEUTROS ABS: 2 10*3/uL (ref 1.4–6.5)
NEUTROS PCT: 40.2 %
Platelet: 233 10*3/uL (ref 150–440)
RBC: 4.58 10*6/uL (ref 3.80–5.20)
RDW: 14.3 % (ref 11.5–14.5)
WBC: 4.9 10*3/uL (ref 3.6–11.0)

## 2013-10-30 LAB — BASIC METABOLIC PANEL
ANION GAP: 9 (ref 7–16)
BUN: 9 mg/dL (ref 7–18)
CREATININE: 0.63 mg/dL (ref 0.60–1.30)
Calcium, Total: 7.6 mg/dL — ABNORMAL LOW (ref 8.5–10.1)
Chloride: 94 mmol/L — ABNORMAL LOW (ref 98–107)
Co2: 23 mmol/L (ref 21–32)
EGFR (African American): >60
EGFR (Non-African Amer.): >60
Glucose: 84 mg/dL (ref 65–99)
Osmolality: 251 (ref 275–301)
Potassium: 3.2 mmol/L — ABNORMAL LOW (ref 3.5–5.1)
Sodium: 126 mmol/L — ABNORMAL LOW (ref 136–145)

## 2013-10-30 LAB — TSH: Thyroid Stimulating Horm: 1.26 u[IU]/mL

## 2013-10-31 LAB — BASIC METABOLIC PANEL
Anion Gap: 4 — ABNORMAL LOW (ref 7–16)
BUN: 9 mg/dL (ref 7–18)
CALCIUM: 8.8 mg/dL (ref 8.5–10.1)
CREATININE: 0.64 mg/dL (ref 0.60–1.30)
Chloride: 101 mmol/L (ref 98–107)
Co2: 26 mmol/L (ref 21–32)
EGFR (African American): >60
EGFR (Non-African Amer.): >60
GLUCOSE: 97 mg/dL (ref 65–99)
Osmolality: 261 (ref 275–301)
Potassium: 4.3 mmol/L (ref 3.5–5.1)
Sodium: 131 mmol/L — ABNORMAL LOW (ref 136–145)

## 2013-12-26 ENCOUNTER — Encounter: Payer: Self-pay | Admitting: General Surgery

## 2014-06-16 NOTE — Op Note (Signed)
PATIENT NAME:  Sandra Duke, ASK MR#:  [id number] DATE OF BIRTH:  07-07-1935  DATE OF PROCEDURE:  05/07/2011  PREOPERATIVE DIAGNOSIS: Right Colles fracture.   POSTOPERATIVE DIAGNOSIS: Right Colles fracture.  PROCEDURE: Open reduction internal fixation right distal radius.   ANESTHESIA: General.  SURGEON: Laurene Footman, MD.   DESCRIPTION OF PROCEDURE: The patient was brought to the operating room and after adequate general anesthesia was obtained, the right arm was prepped and draped in the usual sterile fashion with a tourniquet applied to the upper arm after prepping and draping, appropriate patient identification and timeout procedures were completed. Finger Trap traction was applied off the end of the table and the tourniquet raised to 250 mmHg. A volar incision was made centered on the FCR tendon. The incision was carried down to the tendon sheath, which was incised and the tendon was retracted radially. The deep tendon sheath was then incised and the pronator was elevated off the distal fragment. With the Finger Trap traction applied and slight volar flexion, near anatomic alignment was obtained. A short narrow DVR plate was then applied to the volar surface with 3 cortical screws placed in the shaft. The peg holes were then filled using standard technique, drilling through the fast guides, measuring and placing smooth pegs. After all these pegs had been filled, the traction was released. Under fluoroscopic exam, the fracture was stable without any loss of alignment. There was intra-articular extension of the fracture and the joint appeared to be restored without any hardware in the joint. The wound was irrigated and then the tourniquet let down with hemostasis checked with electrocautery. The wound was closed with 3-0 Vicryl subcutaneously followed by 4-0 nylon. Xeroform, 4 x 4's, Webril, and a volar splint were applied. The patient was sent to the recovery room in stable condition.   ESTIMATED  BLOOD LOSS: Minimal.   COMPLICATIONS: None.   SPECIMEN: None.   IMPLANT: [employer] short narrow DVR plate with screws and pegs.   TOURNIQUET TIME: 18 minutes at 250 mmHg.    ____________________________ Laurene Footman, MD [email or web address] D: 05/07/2012 00:27:39 ET T: 05/07/2012 06:23:53 ET JOB#: [id number]  cc: Laurene Footman, MD, <Dictator> Laurene Footman MD ELECTRONICALLY SIGNED 05/07/2012 7:30

## 2014-06-17 NOTE — Discharge Summary (Signed)
Dates of Admission and Diagnosis:  Date of Admission 29-Oct-2013   Date of Discharge 31-Oct-2013   Admitting Diagnosis hyponatremia   Final Diagnosis Confusion- altered mental status due to metabolic encephalopathy- hyponatremia Hyponatremia- secondary to Likely SIADH- and more liquid intake. essential hypertension    Chief Complaint/History of Present Illness a 79 year old white female with history of chronic atrial fibrillation, previous history of TIA in the past, history of COPD, hypertension, GERD, who according to the daughters was doing well up until yesterday. Her mental status was normal and then today when they went to see her she was confused, was trying to take her clothes off and anxious. The patient has a history of having a low sodium in the past and she does live by herself and they try to make sure that she drinks plenty of fluid. The patient has had a sodium low as 117 in May , who is brought in again with confusion. Noted to have a sodium of 122. Her rest of her evaluation in the ED is normal. The patient was agitated when she arrived and received a dose of Ativan and she is currently a little sleepy but is responding and is able to recognize her daughters. They report that she has not had any fevers or chills. She had 1 episode of diarrhea noted earlier this morning. The patient otherwise unable to give me any history.   Allergies:  Sulfa: Swelling, Other  Thyroid:  06-Sep-15 04:59   Thyroid Stimulating Hormone 1.26 (0.45-4.50 (IU = International Unit)  ----------------------- Pregnant patients have  different reference  ranges for TSH:  - - - - - - - - - -  Pregnant, first trimetser:  0.36 - 2.50 uIU/mL)  Hepatic:  05-Sep-15 18:09   Bilirubin, Total 0.8  Alkaline Phosphatase 50 (46-116 NOTE: New Reference Range 09/13/13)  SGPT (ALT) 16 (14-63 NOTE: New Reference Range 09/13/13)  SGOT (AST) 20  Total Protein, Serum 6.8  Albumin, Serum 3.5  TDMs:   05-Sep-15 18:09   Digoxin, Serum  < 0.1 (Therapeutic range for digoxin in patients with atrial fibrillation: 0.8 - 2.0 ng/mL. In patients with congestive heart failure a therapeutic range of 0.5 - 0.8 ng/mL is suggested as higher levels are associated with an increased risk of toxicity without clear evidence of enhanced efficacy. Digoxin toxicity is commonly associated with serum levels > 2.0 ng/mL but may occur with lower levels, including those in the therapeutic range. Blood samples should be obtained 6-8 hours after administration to assure a reasonable volume of distribution.)  Routine Chem:  05-Sep-15 18:09   Glucose, Serum  107  BUN 13  Creatinine (comp) 0.70  Sodium, Serum  122  Potassium, Serum 3.7  Chloride, Serum  86  CO2, Serum 25  Calcium (Total), Serum 8.6  Anion Gap 11  Osmolality (calc) 247  eGFR (African American) >60  eGFR (Non-African American) >60 (eGFR values <58mL/min/1.73 m2 may be an indication of chronic kidney disease (CKD). Calculated eGFR is useful in patients with stable renal function. The eGFR calculation will not be reliable in acutely ill patients when serum creatinine is changing rapidly. It is not useful in  patients on dialysis. The eGFR calculation may not be applicable to patients at the low and high extremes of body sizes, pregnant women, and vegetarians.)  Result Comment AST/POTASSIUM - Slight hemolysis, interpret results with  - caution.  Result(s) reported on 29 Oct 2013 at 06:42PM.    21:03   Result Comment SERUM OSMOLALITY - RESULTS  VERIFIED BY REPEAT TESTING.  - NOTIFIED OF CRITICAL VALUE  - READ-BACK PROCESS PERFORMED.  - CALLED TO MELISSA COBB AT 2129 ON 10/29/13  - [place]  Result(s) reported on 29 Oct 2013 at 09:34PM.  Osmolality, Blood  249  06-Sep-15 04:59   Glucose, Serum 84  BUN 9  Creatinine (comp) 0.63  Sodium, Serum  126  Potassium, Serum  3.2  Chloride, Serum  94  CO2, Serum 23  Calcium (Total), Serum  7.6   Anion Gap 9  Osmolality (calc) 251  eGFR (African American) >60  eGFR (Non-African American) >60 (eGFR values <70mL/min/1.73 m2 may be an indication of chronic kidney disease (CKD). Calculated eGFR is useful in patients with stable renal function. The eGFR calculation will not be reliable in acutely ill patients when serum creatinine is changing rapidly. It is not useful in  patients on dialysis. The eGFR calculation may not be applicable to patients at the low and high extremes of body sizes, pregnant women, and vegetarians.)  07-Sep-15 06:27   Glucose, Serum 97  BUN 9  Creatinine (comp) 0.64  Sodium, Serum  131  Potassium, Serum 4.3  Chloride, Serum 101  CO2, Serum 26  Calcium (Total), Serum 8.8  Anion Gap  4  Osmolality (calc) 261  eGFR (African American) >60  eGFR (Non-African American) >60 (eGFR values <74mL/min/1.73 m2 may be an indication of chronic kidney disease (CKD). Calculated eGFR is useful in patients with stable renal function. The eGFR calculation will not be reliable in acutely ill patients when serum creatinine is changing rapidly. It is not useful in  patients on dialysis. The eGFR calculation may not be applicable to patients at the low and high extremes of body sizes, pregnant women, and vegetarians.)  Misc Urine Chem:  05-Sep-15 18:09   Osmolality, Urine Random 237 (50-1400 300-900 mOsm/kg   with avg Fluid Intake > 850 mOsm/kg  with Fluid Restriction)  Cardiac:  05-Sep-15 18:09   Troponin I 0.02 (0.00-0.05 0.05 ng/mL or less: NEGATIVE  Repeat testing in 3-6 hrs  if clinically indicated. >0.05 ng/mL: POTENTIAL  MYOCARDIAL INJURY. Repeat  testing in 3-6 hrs if  clinically indicated. NOTE: An increase or decrease  of 30% or more on serial  testing suggests a  clinically important change)  Routine UA:  05-Sep-15 18:09   Color (UA) Yellow  Clarity (UA) Clear  Glucose (UA) Negative  Bilirubin (UA) Negative  Ketones (UA) Negative  Specific  Gravity (UA) 1.006  Blood (UA) Negative  pH (UA) 7.0  Protein (UA) Negative  Nitrite (UA) Negative  Leukocyte Esterase (UA) Negative (Result(s) reported on 29 Oct 2013 at 06:41PM.)  RBC (UA) 2 /HPF  WBC (UA) <1 /HPF  Bacteria (UA) NONE SEEN  Epithelial Cells (UA) NONE SEEN  Result(s) reported on 29 Oct 2013 at 06:41PM.  Routine Hem:  05-Sep-15 18:09   WBC (CBC) 5.6  RBC (CBC) 4.87  Hemoglobin (CBC) 15.9  Hematocrit (CBC) 45.7  Platelet Count (CBC) 249 (Result(s) reported on 29 Oct 2013 at 06:32PM.)  MCV 94  MCH 32.7  MCHC 34.8  RDW  14.7  06-Sep-15 04:59   WBC (CBC) 4.9  RBC (CBC) 4.58  Hemoglobin (CBC) 15.2  Hematocrit (CBC) 43.2  Platelet Count (CBC) 233  MCV 94  MCH 33.2  MCHC 35.2  RDW 14.3  Neutrophil % 40.2  Lymphocyte % 41.5  Monocyte % 15.8  Eosinophil % 1.5  Basophil % 1.0  Neutrophil # 2.0  Lymphocyte # 2.0  Monocyte # 0.8  Eosinophil # 0.1  Basophil # 0.0 (Result(s) reported on 30 Oct 2013 at 05:41AM.)   PERTINENT RADIOLOGY STUDIES: XRay:    05-Sep-15 18:26, Chest Portable Single View  Chest Portable Single View   REASON FOR EXAM:    ALTERED MENTAL STATUS, TACHYCARDIA  COMMENTS:       PROCEDURE: DXR - DXR PORTABLE CHEST SINGLE VIEW  - Oct 29 2013  6:26PM     CLINICAL DATA:  Altered mental status.  Tachycardia.  Hypertension.    EXAM:  PORTABLE CHEST - 1 VIEW    COMPARISON:  10/10/2013    FINDINGS:  Cardiomegaly and changes of COPD are again noted. No evidence of  pulmonary infiltrate or pleural effusion. Calcified granuloma again  seen in the right lung base. Surgical clips again seen in the right  axilla.Multiple left rib fracture deformities again noted.     IMPRESSION:  Stable cardiomegaly and COPD.  No active lung disease.      Electronically Signed    By: Earle Gell M.D.    On: 10/29/2013 18:40         Verified By: Marlaine Hind, M.D.,  [place]:    05-Sep-15 17:54, CT Head Without Contrast  PACS Image     05-Sep-15  18:26, Chest Portable Single View  PACS Image   CT:    05-Sep-15 17:54, CT Head Without Contrast  CT Head Without Contrast   REASON FOR EXAM:    altered mental status  COMMENTS:       PROCEDURE: CT  - CT HEAD WITHOUT CONTRAST  - Oct 29 2013  5:54PM     CLINICAL DATA:  Altered mental status.  History of breast cancer.    EXAM:  CT HEAD WITHOUT CONTRAST    TECHNIQUE:  Contiguous axial images were obtained from the base of the skull  through the vertex without intravenous contrast.    COMPARISON:  Brain MRI 08/22/2013  FINDINGS:  Again seen are periventricular white matter hypodensities which are  nonspecific but compatible with moderate chronic small vessel  ischemic disease. There is no evidence of acute cortical infarct,  intracranial hemorrhage, mass, midline shift, or extra-axial fluid  collection. Mild cerebral atrophy is unchanged.    Prior bilateral cataract extraction is noted. A small amount of  fluid is partially visualized in the right maxillary sinus.     IMPRESSION:  1. No acute intracranial abnormality.  2. Moderate chronic small vessel ischemic disease.  3. Right maxillary sinus fluid, correlate clinically for signs of  acute sinusitis.  Electronically Signed    By: Logan Bores    On: 10/29/2013 18:07         Verified By: Ferol Luz, M.D.,   Pertinent Past History:  Pertinent Past History 1. History of chronic atrial fibrillation 2. History of right upper lobe lung nodule.  3. History of smoking, quit in April.  4. History of stage I breast cancer status post lumpectomy and radiation therapy.  5. COPD.  6. Hypertension.  7. GERD.  8. Peripheral vascular disease with AAA repair.  9. Hyperlipidemia.  10. History of pelvic fracture.  11. History of multiple falls.  12. History of TIAs.   Hospital Course:  Hospital Course a 79 year old white female with previous history of hyponatremia in the past due to dehydration, who is brought in with  confusion.  1. Acute encephalopathy, likely due to hyponatremia     Urine osmolarity is unexpectedly high- so stopped IV fluids and  started on fluid restriction for SIADH- spoke to nephrology- follow as out pt- as Na is up today.   More alert. d/c home. 2. Atrial fibrillation.  continue eliquis as taking at home.  3. Chronic obstructive pulmonary disease. continue ProAir. no active wheezing. 4. Hypertension. Continue lisinopril. stable. 5. hypokalemia- replace oral.   Condition on Discharge Stable   Code Status:  Code Status Full Code   DISCHARGE INSTRUCTIONS HOME MEDS:  Medication Reconciliation: Patient's Home Medications at Discharge:     Medication Instructions  calcium 500+d  1 tab(s) orally 2 times a day (with meals)   proair hfa cfc free 90 mcg/inh inhalation aerosol  2 puff(s) inhaled 4 times a day as needed for shortness of breath/ wheezing    eliquis 5 mg oral tablet  1 tab(s) orally 2 times a day   montelukast 10 mg oral tablet  1 tab(s) orally once a day (in the evening)   lisinopril 20 mg oral tablet  1 tab(s) orally once a day   anoro ellipta 62.5 mcg-25 mcg inhalation powder  1 puff(s) inhaled once a day   fluticasone nasal 50 mcg/inh nasal spray  1 spray(s) into each nostril once a day     Physician's Instructions:  Home Health? Yes   Home Health Service Physicial Therapy   Diet Regular   Activity Limitations As tolerated   Return to Work Not Applicable   Time frame for Follow Up Appointment 1-2 weeks  nephrology clinic.     Holley Raring Munsoor N(Consultant): [facility], [address] [place], [place], [place] [address], [place] [phone]  Electronic Signatures: Vaughan Basta (MD)  (Signed 10-Sep-15 16:07)  Authored: ADMISSION DATE AND DIAGNOSIS, CHIEF COMPLAINT/HPI, Allergies, PERTINENT LABS, PERTINENT RADIOLOGY STUDIES, PERTINENT PAST [place], PATIENT INSTRUCTIONS, Follow Up  Physician   Last Updated: 10-Sep-15 16:07 by Vaughan Basta (MD)

## 2014-06-17 NOTE — Discharge Summary (Signed)
PATIENT NAME:  Sandra Duke, Sandra Duke MR#:  [id number] DATE OF BIRTH:  1936/01/23  DATE OF ADMISSION:  08/21/2013 DATE OF DISCHARGE:  08/23/2013  PRESENTING COMPLAINT:  Dizziness and unsteady gait.  DISCHARGE DIAGNOSES: 1.  Dizziness and unsteady gait due to benign positional vertigo.  2.  Hypertension. 3.  Chronic atrial fibrillation.  CONDITION ON DISCHARGE:  Fair.  DISCHARGE MEDICATIONS:  1.  Meclizine 25 mg t.i.d. p.r.n. 2.  Metoprolol 50 mg b.i.d. 3.  ProAir HFA, 2 puffs 4 times daily as needed. 4.  Apixaban 5 mg b.i.d. 5.  Docusate 100 mg b.i.d. as needed. 6.  Calcium with vitamin D 1 b.i.d. 7.  Acetaminophen/hydrocodone 300/5, 1 tablet every 4 to 6 hours as needed.  DISCHARGE FOLLOWUP: 1.  Continue outpatient physical therapy. 2.  Followup with Dr. Brynda Greathouse in 1 to 2 weeks.   CONSULTATIONS:  Neurology, Dr. Irish Elders.   RESULTS:  Radiological studies; MRI of the brain negative for CVA. Carotid Doppler showed  acute bilateral atherosclerotic plaque, left greater than right. No focal hemodynamic significant stenosis. MRA of the brain showed severe stenosis in the right posterior cerebral artery which could be due to acute clot or chronic atherosclerotic disease, artifactual, decreased signal in the middle cerebral artery bilaterally. Basic metabolic panel and CBC within normal limits.  BRIEF SUMMARY OF HOSPITAL COURSE:  [name] is a 79 year old Caucasian female who comes into the emergency room with increasing dizziness and unsteady gait. She was admitted with; 1. Vertigo/dizziness, which was suspected due to benign positional vertigo.  She was seen by Dr. Irish Elders, neurology, and there was no evidence of stroke. MRI of the brain was negative for CVA. She was also continued on p.r.n. meclizine.  2. History of chronic A. fib, on Eliquis for anticoagulation; remained stable. 3. Hypertension, resumed her low-dose beta blockers. 4. GERD, on Protonix.  DISCHARGE FOLLOWUP: The patient  will follow with her primary care physician, Dr. Brynda Greathouse, in 1 to 2 weeks.  TIME SPENT: 40 minutes.    ____________________________ Hart Rochester Posey Pronto, MD sap:aj D: 08/26/2013 13:26:00 ET T: 08/27/2013 01:09:41 ET JOB#: [id number]  cc: Mikeal Hawthorne. Brynda Greathouse, MD Rosette Bellavance A. Posey Pronto, MD, <Dictator>   Ilda Basset MD ELECTRONICALLY SIGNED 09/20/2013 15:34

## 2014-06-17 NOTE — H&P (Signed)
PATIENT NAME:  Sandra Duke, Sandra Duke MR#:  [id number] DATE OF BIRTH:  Mar 24, 1935  DATE OF ADMISSION:  10/29/2013  PRIMARY CARE PROVIDER: Dr. Nicky Pugh.  EMERGENCY DEPARTMENT REFERRING PHYSICIAN: Dr. Reita Cliche.   CHIEF COMPLAINT: Altered mental status.   HISTORY OF PRESENT ILLNESS: The patient is a 79 year old white female with history of chronic atrial fibrillation, previous history of TIA in the past, history of COPD, hypertension, GERD, who according to the daughters was doing well up until yesterday. Her mental status was normal and then today when they went to see her she was confused, was trying to take her clothes off and anxious. The patient has a history of having a low sodium in the past and she does live by herself and they try to make sure that she drinks plenty of fluid. The patient has had a sodium low as 117 in May , who is brought in again with confusion. Noted to have a sodium of 122. Her rest of her evaluation in the ED is normal. The patient was agitated when she arrived and received a dose of Ativan and she is currently a little sleepy but is responding and is able to recognize her daughters. They report that she has not had any fevers or chills. She had 1 episode of diarrhea noted earlier this morning. The patient otherwise unable to give me any history.   PAST MEDICAL HISTORY:  1. History of chronic atrial fibrillation 2. History of right upper lobe lung nodule.  3. History of smoking, quit in April.  4. History of stage I breast cancer status post lumpectomy and radiation therapy.  5. COPD.  6. Hypertension.  7. GERD.  8. Peripheral vascular disease with AAA repair.  9. Hyperlipidemia.  10. History of pelvic fracture.  11. History of multiple falls.  12. History of TIAs.   PAST SURGICAL HISTORY:  1. Status post cystocele repair.  2. Status post ORIF of the right hip. 3. Status post left breast lumpectomy.  4. Status post hysterectomy.  5. Status post cholecystectomy.  6.  Status post tonsillectomy.  7. Status post AAA repair.  8. Status post .   ALLERGIES: TO SULFA.   CURRENT MEDICATIONS AT HOME: This is the list that the daughters brought: Fluticasone 50 mcg to each nostril daily, ProAir 2 puffs 4 times a day as needed, Eliquis 5 mg 1 tab p.o. b.i.d., Singulair 10 daily, lisinopril 20 daily. She is on Anoro inhalation daily.   SOCIAL HISTORY: Used to smoke, but quit in April. No alcohol or drug use. Lives by herself.   FAMILY HISTORY: Positive for father had diabetes and MI. Mother was murdered. Her sister had MI as well.   REVIEW OF SYSTEMS: Unobtainable due to patient being confused.   PHYSICAL EXAMINATION:  VITAL SIGNS: Temperature 97.5, pulse on arrival was 127 but now 86, respirations 19, blood pressure 134/110, O2 of 90% on room air.  GENERAL: The patient is a well-developed, well-nourished female, appears currently comfortable, in no acute distress.  HEENT: Head atraumatic, normocephalic. Pupils equally round, reactive to light and accommodation. There is no conjunctival pallor. No sclera icterus. Nasal exam shows no drainage or ulceration.  OROPHARYNX: Clear without any exudate.  NECK: Supple without any JVD. No thyromegaly.  EARS: External ear exam shows no drainage or erythema. Nasal exam shows no ulceration or drainage.  CARDIOVASCULAR: Irregularly irregular rhythm. No murmurs, rubs, clicks, gallops. PMI is not displaced.  LUNGS: No accessory muscle usage. Clear to auscultation bilaterally without  any rales, rhonchi, wheezing.  ABDOMEN: Soft, nontender, nondistended. Positive bowel sounds x 4. No hepatosplenomegaly.  EXTREMITIES: No clubbing, cyanosis, edema.  SKIN: No rash.  LYMPHATICS: No lymph nodes palpable.  VASCULAR: Good DP, PT pulses.  PSYCHIATRIC: Currently not anxious or depressed. Not oriented to place, but oriented to person. Not oriented to time.  NEUROLOGICAL: Cranial nerves II through XII grossly appear intact. No focal  deficits.   LABORATORY DATA: Glucose 107, BUN 13, creatinine 0.7, sodium is 122, potassium 3.7, chloride 86, CO2 of 25, calcium is 8.6. LFTs are normal. CT scan of the head without contrast showed no acute intracranial abnormality, moderate chronic vessel ischemic changes, right maxillary sinus fluid.   ASSESSMENT AND PLAN: The patient is a 79 year old white female with previous history of hyponatremia in the past due to dehydration, who is brought in with confusion.  1. Acute encephalopathy, likely due to hyponatremia again. Possibly due to dehydration; however, the patient appears to be hydrated well. Her daughters report that she usually has a lot of water by her side, so this could be also possible polydipsia. We will check serum and urine osmolalities. I am going to started her on a sodium. Monitor her sodium.  2. Atrial fibrillation. We will continue liquids as taking at home.  3. Chronic obstructive pulmonary disease. We will continue ProAir. We will hold Anoro since it is not on the hospital formulary.  4. Hypertension. Continue lisinopril.   CODE STATUS: Full.  TIME SPENT: 60 minutes on this patient.   ____________________________ Lafonda Mosses. Posey Pronto, MD shp:lt D: 10/29/2013 20:23:45 ET T: 10/29/2013 21:24:55 ET JOB#: [id number]  cc: Naisha Wisdom H. Posey Pronto, MD, <Dictator> Alric Seton MD ELECTRONICALLY SIGNED 11/11/2013 8:37

## 2014-06-17 NOTE — Discharge Summary (Signed)
PATIENT NAME:  Sandra Duke, Sandra Duke MR#:  [id number] DATE OF BIRTH:  January 08, 1936  DATE OF ADMISSION:  07/11/2013  DATE OF DISCHARGE:  07/13/2013  ADMITTING DIAGNOSES:  1. Generalized weakness.  2.  Hyponatremia. 3.  Rib fracture.  DISCHARGE DIAGNOSES:  1.  Generalized weakness. 2.  Hyponatremia due to dehydration as well as HCTZ use.  3.  Urinary tract infection. 4.  Streptococcus agalactiae group B. 5.  Suspected pneumonia.  6.  Left-sided closed rib fracture after fall. 7.  Hypomagnesemia. 8. Hypertension, gastroesophageal reflux disease, chronic obstructive pulmonary disease, tobacco abuse, hyperlipidemia, history of lung nodules, as well as breast carcinoma.   DISCHARGE CONDITION: Stable.   DISCHARGE MEDICATIONS: The patient is to continue  1.  Caltrate 600 with vitamin D, 400 units 1 tablet once daily.  2.  Multivitamins 1 daily.  3.  Prilosec 20 mg p.o. daily.  4.  Lisinopril 20 mg p.o. daily.  5.  Tylenol 325 mg 2 tablets every 6 hours as needed.  6.  Acetaminophen/hydrocodone 325/5 mg 1 tablet every 4 hours as needed.  7.  Senna 1 tablet twice daily as needed.  8.  Docusate sodium 100 mg p.o. twice daily.  9.  Levaquin 250 mg p.o. daily for the next 5 days.  10.  Ensure 240 mg twice daily.   HOME OXYGEN: None.   DIET: 2 grams salt, low fat, low cholesterol. Ensure 2 or 3 times a day. Diet consistency: Regular.   ACTIVITY LIMITATIONS: As tolerated.   FOLLOWUP APPOINTMENT: With outpatient physical therapy. Follow up with Dr. Brynda Greathouse in 2 days after discharge. The patient was advised to continue incentive spirometry every 1 to 2 hours.   CONSULTANTS: Care Management, Social Work, Physical Therapy.   RADIOLOGIC STUDIES: Chest x-ray portable single view 07/11/2013 showed multiple acute posterolateral rib fractures on the left, no evidence of pneumothorax, but a small pleural effusion, no pneumothorax was suspected. CT scan of cervical spine without contrast as well as CT of the  head without contrast revealed atrophy with patchy periventricular small vessel disease, left parietal scalp hematoma, no intracranial mass, hemorrhage or extra-axial fluid collection, multilevel osteoarthritic changes, no fracture or spondylolisthesis, there is carotid artery calcification bilaterally also noted.  REASON FOR ADMISSION:  The patient is a 79 year old Caucasian female with past medical history significant for history of hypertension, who was on HCTZ in the past, who presents to the hospital with complaints of weakness as well as falls. Please refer to Dr. Keenan Bachelor admission note on 07/11/2013. On arrival to the hospital, patient's vital signs were temperature 98, pulse was 92, respiration rate was 18, blood pressure 150/99, saturation was 100% on oxygen therapy. Physical exam revealed a few rhonchi heard and diminished breath sounds on the left, but not in overt respiratory distress.   The patient's lab data done on admission to the Emergency Room showed a sodium level of 117, potassium 3.4, beta-type natriuretic peptide was 1810, glucose 103, otherwise BMP was unremarkable. Magnesium level was low at 1.4. Liver enzymes were normal. Cardiac enzymes x 1 were within normal limits. TSH was normal at 1.07. CBC was within normal limits. Urinalysis revealed 234 white blood cells and 13 red blood cells, 3+ leukocyte esterase. Urine culture showed more than 100,000 colony-forming streptococcal agalactiae group B.   HOSPITAL COURSE:  The patient was admitted to the hospital for further evaluation. She was started on IV fluids, and her sodium level improved to 128 on the day of discharge, 07/13/2013. It was felt  that patient's hyponatremia was very likely due to dehydration as well as possibly use of HCTZ. The HCTZ was stopped. In regards to generalized weakness, it was felt that patient's generalized weakness was very likely related to hyponatremia as well as urinary tract infection. The patient was  evaluated by a physical therapist, and outpatient physical therapy was recommended upon discharge. In regards to UTI, it was Streptococcus agalactiae or group B streptococcus urinary tract infection. The patient is to continue antibiotic therapy for 5 more days to complete course. It was felt that patient also may have had mild pneumonia, since she had some abnormal lung exam. She is to continue Levaquin therapy for suspected pneumonitis. In regards to her left-sided closed rib fracture after fall, patient was advised to continue pain medications.   For hypomagnesemia, magnesium was supplemented IV as well as orally. For hypertension,  patient is to continue her outpatient medications, except the HCTZ. For gastroesophageal reflux disease, patient is to continue Prilosec. For history of COPD/tobacco abuse, she is to continue her outpatient management. In regards to hyperlipidemia, patient was advised to continue low fat, low cholesterol diet. The patient is to follow up with her primary care physician, Dr. Brynda Greathouse, in the next few days after discharge. On the day of discharge, the patient's vital signs, temperature was 97.7, pulse was 94, respiratory rate was 18, blood pressure 150, ranging from 761 to 607P systolic and 71G to 626 diastolic, O2 sats were 94% on room air at rest.   TIME SPENT: 40 minutes.    ____________________________ Theodoro Grist, MD rv:mr D: 07/13/2013 17:19:00 ET T: 07/13/2013 19:35:02 ET JOB#: [id number]  cc: Mikeal Hawthorne. Brynda Greathouse, MD Theodoro Grist, MD, <Dictator>   Clemon Devaul Ether Griffins MD ELECTRONICALLY SIGNED 07/27/2013 8:35

## 2014-06-17 NOTE — H&P (Signed)
PATIENT NAME:  Sandra Duke, Sandra Duke MR#:  [id number] DATE OF BIRTH:  [birth year]  DATE OF ADMISSION:  08/21/2013  REASON FOR ADMISSION: Worsening dizziness, multiple falls, and a TIA event happening 2-3 days ago.   PRIMARY CARE PHYSICIAN: Dr. Brynda Greathouse  CARDIOLOGIST:  Dr. Saralyn Pilar   REFERRING PHYSICIAN: Dr. Ferman Hamming  HISTORY OF PRESENT ILLNESS: This is a very nice 79 year old female with history of COPD, smoking, hypertension, who presented to the hospital with the same complaint as she came up last admission on 07/11/2013 when she started having dizziness with dizzy spells and falls. The last time the patient had some broken ribs.  As of today, she still has pleural effusion on the left side where the pleural effusion is slightly increased than it was before. The patient is concerned about worsening dizziness and not able to get around very well. The patient has never been seen by a neurologist, but she has been seen by her cardiologist, Dr. Saralyn Pilar, On Thursday, she had an episode that lasted several hours where she was confused with slurred speech. They went to see Dr. Saralyn Pilar who changed her blood thinner to Eliquis because of the concern of TIAs. The patient did not come to the hospital at that time.  At this moment, the patient comes in today because she does not feel any better. Her dizziness is worse. She cannot get around very well because of it. She states that she has been having significant pain on the left side of her chest, which actually today is 0/10, but she was concerned about it yesterday. This is secondary to broken ribs that happened last time that she fell, which they were diagnosed already.  Overall, the patient is giving me very unspecific symptoms and she says that the reason that she is here is just because she does not find any answers. The patient asked to be transferred to [facility] and Dr. Benjaman Lobe was doing the transfer from ER to ER, but at some point the patient changed her  mind and decided to stay over here.  Patient was admitted for evaluation of possible transient ischemic attack and vertigo.   REVIEW OF SYSTEMS:   Thirteen system review of systems is done. CONSTITUTIONAL: No fever, fatigue, weakness, weight loss or weight gain.  EYES: No double vision or blurry vision.  EARS, NOSE, THROAT: No difficulty swallowing or tinnitus.  RESPIRATORY: No cough, wheezing, or hemoptysis. Positive atelectasis of the left side with broken ribs with pleuritic chest pain, which today is 0/10. CARDIOVASCULAR: No orthopnea, edema. No cardiac chest pain. No palpitations or syncope. Positive atrial fibrillation.  GASTROINTESTINAL: No nausea, vomiting, abdominal pain, constipation, diarrhea.  GENITOURINARY: No dysuria, hematuria, changes in frequency.  HEMATOLOGIC AND LYMPHATIC: No anemia or easy bruising.  SKIN: No rashes, petechiae.  Positive bruising at the level of the arm since her last admission.  NEUROLOGIC: No numbness, tingling. Positive vertigo, positive difficulty ambulating. Positive TIAs.  PSYCHIATRIC: No insomnia or depression.   PAST MEDICAL HISTORY:  1. Chronic atrial fibrillation.  2. Right upper lobe lung nodules, well-known.  3. Former smoker.  4. History of stage I breast carcinoma, status post lumpectomy, and radiation therapy.  5. Chronic obstructive pulmonary disease.  6. Hypertension.  7. GERD.  8. Peripheral vascular disease with AAA repair in the past.  9. Hyperlipidemia.  10. History of pelvic fracture.  11. History of multiple falls.   SURGICAL HISTORY:  1. Cystocele repair.  2. Open reduction and internal fixation of the right  hip.  3. Left breast lumpectomy.  4. Hysterectomy.  5. Cholecystectomy.  6. Tonsillectomy.  7. AAA repair.  8. Hernioplasty.   ALLERGIES: SULFAS.   SOCIAL HISTORY: The patient is a former smoker. She quit smoking about 3 months ago or 4 months ago. Before that, she smoked less than pack a day for many years. She  was still smoking a little bit less than that for the past year because she was hiding herself when she smoked. She occasionally drinks wine, but rarely. No drug abuse. She lives by herself. She used to work on [employer], [employer].   FAMILY HISTORY: Positive for father had diabetes and an MI. Apparently, her mother was murdered. Her sister had an MI as well.   CURRENT MEDICATIONS:  1. Metoprolol 50 mg once a day.  2. Eliquis 5 mg twice daily.  3. Norco 5/325 mg every 6 hours as needed for pain.  4. Caltrate 500 mg twice daily with meals.  5. Docusate 100 mg 2 times a day.  6. ProAir as needed for shortness of breath.   PHYSICAL EXAMINATION:  VITAL SIGNS: Blood pressure initially 172/118; right now, 156/131. Pulse 121, respirations 13, temperature 97.6, oxygen saturation 92% on room air; right now, 96% on room air.  GENERAL: The patient is alert, oriented x 3. No acute distress. No respiratory distress. Hemodynamically stable.  HEENT: Pupils are equal and reactive. Extraocular movements are intact. Mucosa are moist. Anicteric sclerae. Pink conjunctivae. No oral lesions. No oropharyngeal exudates.  NECK: Supple. No JVD. No thyromegaly. No adenopathy. No carotid bruits. No rigidity.  CARDIOVASCULAR: Irregularly irregular. No murmurs, rubs, or gallops are appreciated. No displacement of PMI.  LUNGS: Mostly clear with decreased respiratory sounds and crackling at the level of the left base. No dullness to percussion.  ABDOMEN: Soft, nontender, nondistended. No hepatosplenomegaly. No masses. By the way, no use of accessory muscles. So, her abdomen again no masses, soft, nontender, not distended.  GENITAL: Negative for external lesions.  EXTREMITIES: No edema, cyanosis or clubbing.  VASCULAR: Pulses +2. Capillary refill less than 3.  LYMPHATIC: Negative for lymphadenopathy in neck or supraclavicular areas.  NEUROLOGIC: Cranial nerves II through XII intact. Strength is 5/5 upper extremities. DTRs +2. No  nystagmus. No dysmetria.  PSYCHIATRIC: No agitation. The patient is alert, oriented x 3.   RESULTS: Glucose 101, creatinine 0.71, sodium 134, potassium 4.2. LFTs within normal limits. White count 5.8, hemoglobin 14. Platelet count 307,000. Urinalysis negative for s/s UTI. INR 1.2.  Chest x-ray as mentioned above may be slight increase in her previous known pleural effusion, which is likely due to atelectasis secondary to fractures on the ribs.  EKG: Atrial fibrillation with rapid ventricular response earlier.  Right now, heart rate 109.   ASSESSMENT AND PLAN: A 79 year old female with history of dizziness and falls. She has also atrial fibrillation, gastroesophageal reflux, former smoker. Admission due to worsening dizziness unable to ambulate very well, multiple falls.  1. Dizziness. The patient has been evaluated for this by cardiology as she has history of atrial fibrillation. The evaluation has been made with an echocardiogram which I do not have results, although we can get from Dr. Saralyn Pilar for now. Dr. Saralyn Pilar has changed her medications, especially her blood thinners. Before, the patient was told that she could not have Coumadin or blood thinners because of her multiple falls, but she continues to have episodes which appear to be transient ischemic attacks with worsening dizziness and slurred speech for which she was put on  Eliquis 2-3 days ago. Right now, the patient is hemodynamically stable. No neurologic findings. She is going to be continued on Eliquis.  2. The patient has elevation of blood pressure. She has been taken off her blood pressure medications by Dr. Brynda Greathouse just because of all the dizzy spells, but now, she has accelerated hypertension. We are going to admit her to the hospital, get an ultrasound of the carotid arteries. We are going to continue metoprolol XL to control her rate. We are going to give hydralazine as needed if her blood pressure is high. I am going to try to allow  hypertension to run a little bit high as much as possible in the case of acute stroke, although most of her symptoms happened 3-4 days ago so she should be on actually good time to treat her blood pressure a little bit aggressively. We are going to get a neurology consultation. Continue Eliquis.  3. MRI of the brain has been ordered to evaluate the possibility of transient ischemic attack and evaluation of vertigo. CT scan of the brain is normal.  4.  Permissive hypertension. Start her back on metoprolol that she has  been taking and started on hydralazine p.r.n. monitor closely. Allow the blood pressure to be high if there are any changes on her mental status or neurologic exam. Neurochecks have been ordered. Peripheral vascular disease. The patient has AAA repair.  At this moment, stable.  5. Gastroesophageal reflux disease. Continue to monitor. At this moment, we are going to give gastrointestinal prophylaxis with Protonix.  6. Deep vein thrombosis prophylaxis with Eliquis.  7. Pleural effusion secondary to atelectasis due to broken ribs. At this moment, the patient is absolutely asymptomatic.  8. Hyponatremia, minimal with a sodium of 134. TIME SPENT: I spent about 50 minutes with this patient today.   CODE STATUS: She is full code.    ____________________________ Holley Sink, MD rsg:dd D: 08/21/2013 16:14:44 ET T: 08/21/2013 17:47:22 ET JOB#: [id number]  cc: Byrnes Mill Sink, MD, <Dictator> ROBERTO America Brown MD ELECTRONICALLY SIGNED 09/08/2013 1:44

## 2014-06-17 NOTE — Discharge Summary (Signed)
PATIENT NAME:  Sandra Duke, Sandra Duke MR#:  [id number] DATE OF BIRTH:  03-06-1935  DATE OF ADMISSION:  08/21/2013 DATE OF DISCHARGE:  08/23/2013  PRESENTING COMPLAINT:  Dizziness and unsteady gait.  DISCHARGE DIAGNOSES: 1.  Dizziness and unsteady gait due to benign positional vertigo.  2.  Hypertension. 3.  Chronic atrial fibrillation.  CONDITION ON DISCHARGE:  Fair.  DISCHARGE MEDICATIONS:  1.  Meclizine 25 mg t.i.d. p.r.n. 2.  Metoprolol 50 mg b.i.d. 3.  ProAir HFA, 2 puffs 4 times daily as needed. 4.  Apixaban 5 mg b.i.d. 5.  Docusate 100 mg b.i.d. as needed. 6.  Calcium with vitamin D 1 b.i.d. 7.  Acetaminophen/hydrocodone 300/5, 1 tablet every 4 to 6 hours as needed.  DISCHARGE FOLLOWUP: 1.  Continue outpatient physical therapy. 2.  Followup with Dr. Brynda Greathouse in 1 to 2 weeks.   CONSULTATIONS:  Neurology, Dr. Irish Elders.   RESULTS:  Radiological studies; MRI of the brain negative for CVA. Carotid Doppler showed  acute bilateral atherosclerotic plaque, left greater than right. No focal hemodynamic significant stenosis. MRA of the brain showed severe stenosis in the right posterior cerebral artery which could be due to acute clot or chronic atherosclerotic disease, artifactual, decreased signal in the middle cerebral artery bilaterally. Basic metabolic panel and CBC within normal limits.  BRIEF SUMMARY OF HOSPITAL COURSE:  [name] is a 79 year old Caucasian female who comes into the emergency room with increasing dizziness and unsteady gait. She was admitted with; 1.  Vertigo/dizziness, which was suspected due to benign positional vertigo.  She was seen by Dr. Irish Elders, neurology, and there was no evidence of stroke. MRI of the brain was negative for CVA. She was also continued on p.r.n. meclizine.  2.  History of chronic A. fib, on Eliquis for anticoagulation; remained stable. 3.  Hypertension, resumed her low-dose beta blockers. 4.  GERD, on Protonix.  DISCHARGE FOLLOWUP: The  patient will follow with her primary care physician, Dr. Brynda Greathouse, in 1 to 2 weeks.  TIME SPENT: 40 minutes.    ____________________________ Laurence Aly Emilio Math, MD mab:aj D: 08/26/2013 13:26:15 ET T: 08/27/2013 01:09:41 ET JOB#: [id number]  cc: Mohammed A. Emilio Math, MD, <Dictator> Mikeal Hawthorne. Brynda Greathouse, MD

## 2014-06-17 NOTE — H&P (Signed)
PATIENT NAME:  Sandra Duke, MAHN MR#:  [id number] DATE OF BIRTH:  November 18, 1935  DATE OF ADMISSION:  07/11/2013  PRIMARY CARE PHYSICIAN: Dr. Brynda Greathouse.   The patient is a 79 year old Caucasian female with history of chronic obstructive pulmonary disease, smoking, hypertension, who presents to the hospital with complaints of weakness as well as falling. According to the patient as well as her family she fell down a week ago. She does not remember falling. Does not remember what happened to her, it could have been that she had actually a syncope episode. The patient's daughter took her to her own home and was able to take care of her over the past one week. The patient is somewhat improved. She was able to walk with a walker, but still somewhat felt unsteady on her feet and was somewhat confused. She was seen by Dr. Brynda Greathouse over the past one week, and the patient improved enough to return back home yesterday.  She came back home; however, today in the morning, she was found in the floor since she was so weak she could not get up from the floor. She denies any fall or syncopal episode today, however, admits of feeling presyncopal, and she just could not get up. She called her daughter who brought her to the Emergency Room for further evaluation. In the Emergency Room, she was noted to have left rib fractures, closed fractures. She was also noted to have significant hyponatremia with sodium level 117 and potassium level of 3.2 on admission. According to the patient's family, the patient's HCTZ was stopped approximately a week ago.   PAST MEDICAL HISTORY: Significant for history of right upper lobe lung nodules, history of smoking, history of stage I breast carcinoma diagnosed in 2000, status post lumpectomy, radiation therapy, as well as 5 year therapy  therapy with tamoxifen, COPD, hypertension, gastroesophageal reflux disease, chronic smoking, AAA repair, ventral hernia repair, cholecystectomy, hysterectomy, pelvic  fracture in December 2009. hyperlipidemia.    PAST SURGICAL HISTORY: Cystocele and  pubovaginal sling, right hip repair. left breast lump removed,  also hysterectomy,  gallbladder surgery and tonsillectomy.   MEDICATIONS: According to the patient's family, she is taking only Caltrate with vitamin D 400, 1 tablet once daily, multivitamins once daily, Prilosec 10 mg p.o. daily,  lisinopril 10 mg p.o. daily and Tylenol as needed. In the past, apparently she was on hydrochlorothiazide.    ALLERGIES: SULFA.   SOCIAL HISTORY: The patient was former smoker, smoked less than a pack a day for many, many years, quit in 2009.  Alcohol wine occasionally. No drug abuse. Lives alone. Used to work in [employer], trade [employer].   FAMILY HISTORY: The patient's mother died of natural causes at the age of 68. The patient's father died of diabetic complications at the age of 25. He also had myocardial infarction. The patient's mother was murdered. The patient's sister died at age 63 of asthma. Has a sister  at 80 of asthma and myocardial infarction. A brother  had asthma, as well as coronary disease and sister with asthma.   REVIEW OF SYSTEMS: Positive for weight loss approximately 5 pounds over the past one week, pain in the left side of ribs for approximately a week, fatigue and weakness, cataract removal in the past. Using bifocal glasses. Yellow phlegm for a couple of days, shortness of breath intermittently whenever she walks around, as well as cough, which is chronic, as well as intermittent wheezing in the lungs. Edema around ankles for the past few weeks. Also  feeling of presyncopal earlier as well as syncopal episode likely approximately a week ago. Intermittent constipation,  increased frequency of urination for the past four years. Otherwise denies any fevers, weight gain.  EYES: Denies any blurry vision, double vision or glaucoma.  ENT: Denies any tinnitus,  no difficulty swallowing. The patient denies any hemoptysis.  Marland Kitchen CARDIOVASCULAR: Denies any chest pains, orthopnea, arrhythmias, palpitations.  GASTROINTESTINAL: Denies nausea and diarrhea, rectal bleeding, change in bowel habits.  GENITOURINARY: Denies dysuria, hematuria, or incontinence.  ENDOCRINE,: denies  thyroid problems, HEMATOLOGIC: Denies any anemia, easy bruising, bleeding, swollen glands.   SKIN: Denies any acne, rashes or change in moles.  MUSCULOSKELETAL: Denies arthritis, cramps, swelling . NEUROLOGICAL:  No numbness, epilepsy or tremor.  PSYCHIATRIC: Denies anxiety, insomnia, depression.   PHYSICAL EXAMINATION: VITAL SIGNS: On arrival to the hospital, temperature was 98, pulse was 92, respiration was 18, blood pressure 150/99, saturation  100% on room air.  GENERAL: This is a well-developed, well-nourished Caucasian female lying on the stretcher. HEENT: Pupils are equal, reactive to light. Extraocular movements intact. No icterus or conjunctivitis. Has normal hearing . Mucosa is moist.  NECK: No masses. Supple, nontender. Thyroid not enlarged. No adenopathy. No JVD or carotid bruits bilaterally. Full range of motion.  LUNGS: Clear to auscultation in all fields. A few rhonchi were heard, somewhat diminished breath sounds on the left. No wheezing. No labored inspirations, increased effort. A few rhonchi again as well as rales more on the right side, and not in overt respiratory distress. Better air entrance on the right than the left.  CARDIOVASCULAR: S1, S2 appreciated. Rythm was irregularly irregular. PMI not lateralized. Chest is nontender to palpation, except on the left side. No lower extremity edema, calf tenderness or cyanosis was noted.  ABDOMEN: Soft, nontender. Bowel sounds are present. No organomegaly or masses were noted.  RECTAL: Deferred.  MUSCLE STRENGTH: Able to move all extremities. No joint effusions . Gait was not tested.  SKIN: Did not reveal any rashes, lesions, erythema, nodularity or induration. It was warm and dry to  palpation.  LYMPHATIC: No adenopathy in the cervical region.  NEUROLOGICAL: Cranial nerves grossly intact. Sensory is intact. No dysarthria or aphasia. The patient is alert, oriented to person and place, cooperative. Memory is poor but no significant confusion, agitation, or depression was noted.   The patient's EKG showed atrial fibrillation at a rate of 94 beats per minute, normal axis, possible anterior infarct, age indeterminate. No acute ST-T changes were noted. No significant change since prior EKG was noted; however.  Labs revealed BMP showed a glucose of 103, sodium 117. Potassium 3.2, otherwise BMP was unremarkable. The patient's BNP was 1,810. Her liver enzymes were normal. Cardiac enzymes, troponin was less than 0.02. White blood cell count was normal at 7.8, hemoglobin was 14.0, platelet count 257, absolute neutrophil count is not checked.   Urinalysis: Yellow clear urine, negative for glucose, bilirubin or ketones. Specific gravity 1.004, pH was 7.0, 2+ blood, 30 mg/dL protein, negative for nitrites, 3+ leukocyte esterase, 13 red blood cells , 234 white blood cells, no bacteria one epithelial cell and mucus is present.   Radiologic studies: Chest portable single view, 18th of May 2015, showed multiple acute  posterior left shoulder rib fractures on the left. No evidence of pneumothorax, but a small pleural effusion as well as pneumothorax was suspected.   CT of the head and cervical spine without contrast, 07/11/2013, showed atrophy and patchy periventricular small vessel disease, left parietal scalp hematoma,  No intracranial mass, hemorrhage or extra-axial fluid collection. CT of cervical spine showed multilevel osteoarthritic associated changes. No fractures or spondylolisthesis. There is carotid artery calcification bilaterally noted.   ASSESSMENT AND PLAN: 1. Generalized weakness. Admitted the patient to medical floor with telemetry, start her to work with physical therapy,  questionable hyponatremia related. could not rule out stroke as the patient has atrial fibrillation and not on any anticoagulation. We will get social work consult and we will follow up. The patient may need rehabilitation, as well as further evaluation for possible stroke if significant disability.  2. Hyponatremia, questionable HCTZ disease related. Apparently HCTZ was stopped approximately a week ago. We will get cortisol level as well as TSH. We  will start the patient on IV fluids and we will follow sodium level in the morning.  3. Left closed rib  fracture Norco as needed. Physical therapy.  4. Questionable pneumonia. We will start the patient on Levaquin p.o., get sputum cultures.  5. Questionable urinary tract infection. Get cultures and we will continue the patient on Levaquin for now, we will follow culture results.   TIME SPENT: 50 minutes.    ____________________________ Theodoro Grist, MD rv:sg D: 07/11/2013 13:15:00 ET T: 07/11/2013 14:31:55 ET JOB#: [id number]  cc: Mikeal Hawthorne. Brynda Greathouse, MD Theodoro Grist, MD, <Dictator>   Marianita Botkin Ether Griffins MD ELECTRONICALLY SIGNED 08/17/2013 13:39

## 2014-06-17 NOTE — Consult Note (Signed)
PATIENT NAME:  Sandra Duke, Sandra Duke MR#:  [id number] DATE OF BIRTH:  Apr 17, 1935  DATE OF CONSULTATION:  08/22/2013  REFERRING PHYSICIAN:   CONSULTING PHYSICIAN:  Leotis Pain, MD  REASON FOR CONSULTATION: Vertigo.  HISTORY OF PRESENT ILLNESS: This is a pleasant 79 year old female with past medical history of COPD, smoking history, and hypertension presenting to the hospital with vertigo and worsening dizziness status post evaluation 07/11/2013 with similar symptoms at which time the patient fell. On further questioning, the patient does state that her dizziness is positional, more so when she changes her head position. She has positive tinnitus in bilateral ears. The patient follows up with her cardiologist. The patient does have atrial fibrillation and for that reason she was switched to Eliquis at 5 mg twice a day for stroke prevention. Currently symptoms have significantly improved, and the patient is close to baseline. She did see a physical therapist who did what appears to be Dix-Hallpike maneuver recently, and it was positive for bilateral nystagmus, which is consistent with benign positional vertigo.   REVIEW OF SYSTEMS: CONSTITUTIONAL: No fever. No fatigue.  HEENT: No double vision, blurred vision. No difficulty swallowing. Positive for tinnitus. CARDIOVASCULAR: No orthopnea. No edema. No nausea. No vomiting. No dysuria or hematuria.  HEMATOLOGIC: No anemia or easy bruising. No insomnia or depression.   PAST MEDICAL HISTORY: Atrial fibrillation, right upper lobe nodules, smoker, stage I breast carcinoma status post lumpectomy and radiation therapy, COPD, hypertension, GERD, peripheral vascular disease, hyperlipidemia, pelvic fracture and falls in the past.   PAST SURGICAL HISTORY: Cystocele repair, open reduction internal fixation of right hip, left breast lumpectomy, hysterectomy, cholecystectomy, tonsillectomy, abdominal aortic repair and hernioplasty.   ALLERGIES: SULFA.   SOCIAL  HISTORY: The patient is a former smoker, quit about 3 to 4 months ago. Social drinker. No drug use.   HOME MEDICATIONS: Include metoprolol, Eliquis, Norco, docusate, Pro Air.  DIAGNOSTIC DATA: The patient's laboratory work-up has been reviewed.   MRI has been reviewed. No acute intracranial abnormality has been seen by me.   MRA of the brain: The patient does have right PCA territory stenosis.   Ultrasound bilateral: Plaques do not appear to be hemodynamically significant.   PHYSICAL EXAMINATION: VITAL SIGNS: Include a temperature of 97.7, pulse 111, respirations 18, blood pressure 168/112.  NEUROLOGIC: The patient is awake and alert. Could tell me her name. Could not tell me the date, but told me at the right name of the hospital and the reason why she is in the hospital. Speech appears to be fluent. No dysarthria. No aphasia. Cranial nerve examination: Extraocular movements intact. Pupils 4 to 2, reactive bilaterally. Facial sensation intact. Facial motor intact. Tongue is midline. Uvula elevates symmetrically. Shoulder shrug intact. Motor strength appears to be 5/5 in bilateral upper and lower extremities. Sensation intact to light touch and temperature. Reflexes intact. Coordination intact. The patient is able to ambulate with limited assistance. She is positive for Dix-Hallpike maneuver with bilateral nystagmus.  IMPRESSION: A 79 year old female with history of chronic obstructive pulmonary disease, recently stop smoking, past medical history also of hypertension, chronic atrial fibrillation recently started on Eliquis 5 mg twice a day presenting with multiple episodes of dizziness and vertigo. MRI of the brain showed no acute intracranial pathology, found to have right PCA stenosis on her MRA. I think based on her symptoms and condition and description this is benign positional vertigo, which she has bilateral tinnitus. On description of the symptoms, the vertigo is worse with sudden change in  position, specifically looking to either left or the right. I do not think her symptoms are contributed to the stenosis of her right PCA. The PCA does not supply the cerebellum. It supplies the high portion of the brain stem as well as the occipital lobe that would control visual changes.   PLAN: Physical therapy, occupational therapy, physical therapy for evaluation of Epley's maneuver. Please start the patient on p.r.n. meclizine. No further testing from a neurological standpoint. Discharge planning.   This case was discussed with the patient's daughter at bedside and the patient in detail.   Thank you, it was a pleasure seeing this patient.   ____________________________ Leotis Pain, MD yz:sb D: 08/22/2013 15:48:32 ET T: 08/22/2013 16:13:06 ET JOB#: [id number]  cc: Leotis Pain, MD, <Dictator> Leotis Pain MD ELECTRONICALLY SIGNED 09/06/2013 14:54

## 2014-06-18 NOTE — Op Note (Signed)
PATIENT NAME:  Sandra Duke, Sandra Duke MR#:  [id number] DATE OF BIRTH:  11/30/1935  DATE OF PROCEDURE:  06/03/2011  PREOPERATIVE DIAGNOSIS: Visually significant cataract of the right eye.   POSTOPERATIVE DIAGNOSIS: Visually significant cataract of the right eye.   OPERATIVE PROCEDURE: Cataract extraction by phacoemulsification with implant of intraocular lens to right eye.   SURGEON: Birder Robson, MD.   ANESTHESIA:  1. Managed anesthesia care.  2. Topical tetracaine drops followed by 2% Xylocaine jelly applied in the preoperative holding area.   COMPLICATIONS: None.   TECHNIQUE:  Stop and chop.   DESCRIPTION OF PROCEDURE: The patient was examined and consented in the preoperative holding area where the aforementioned topical anesthesia was applied to the right eye and then brought back to the Operating Room where the right eye was prepped and draped in the usual sterile ophthalmic fashion and a lid speculum was placed. A paracentesis was created with the side port blade and the anterior chamber was filled with viscoelastic. A near clear corneal incision was performed with the steel keratome. A continuous curvilinear capsulorrhexis was performed with a cystotome followed by the capsulorrhexis forceps. Hydrodissection and hydrodelineation were carried out with BSS on a blunt cannula. The lens was removed in a stop and chop technique and the remaining cortical material was removed with the irrigation-aspiration handpiece. The capsular bag was inflated with viscoelastic and the Technis ZCB00 22.0-diopter lens, serial number [id number] was placed in the capsular bag without complication. The remaining viscoelastic was removed from the eye with the irrigation-aspiration handpiece. The wounds were hydrated. The anterior chamber was flushed with Miostat and the eye was inflated to physiologic pressure. The wounds were found to be water tight. The eye was dressed with Vigamox. The patient was given protective  glasses to wear throughout the day and a shield with which to sleep tonight. The patient was also given drops with which to begin a drop regimen today and will follow-up with me in one day.   ____________________________ Livingston Diones. Kamee Bobst, MD wlp:cms D: 06/03/2011 12:51:46 ET T: 06/03/2011 13:13:54 ET JOB#: [id number]  cc: Frantz Quattrone L. Dale Ribeiro, MD, <Dictator> Livingston Diones Finnean Cerami MD ELECTRONICALLY SIGNED 06/04/2011 11:41

## 2014-06-18 NOTE — Op Note (Signed)
PATIENT NAME:  Sandra Duke, Sandra Duke MR#:  [id number] DATE OF BIRTH:  [birth year]  DATE OF PROCEDURE:  04/15/2011  PREOPERATIVE DIAGNOSIS: Visually significant cataract of the left eye.   POSTOPERATIVE DIAGNOSIS: Visually significant cataract of the left eye.   OPERATIVE PROCEDURE: Cataract extraction by phacoemulsification with implant of intraocular lens to left eye.   SURGEON: Birder Robson, MD.   ANESTHESIA:  1. Managed anesthesia care.  2. Topical tetracaine drops followed by 2% Xylocaine jelly applied in the preoperative holding area.   COMPLICATIONS: None.   TECHNIQUE:  Four quadrant divide and conquer.  DESCRIPTION OF PROCEDURE: The patient was examined and consented in the preoperative holding area where the aforementioned topical anesthesia was applied to the left eye and then brought back to the Operating Room where the left eye was prepped and draped in the usual sterile ophthalmic fashion and a lid speculum was placed. A paracentesis was created with the side port blade and the anterior chamber was filled with viscoelastic. A near clear corneal incision was performed with the steel keratome. A continuous curvilinear capsulorrhexis was performed with a cystotome followed by the capsulorrhexis forceps. Hydrodissection and hydrodelineation were carried out with BSS on a blunt cannula. The lens was removed in a four quadrant divide and conquer technique and the remaining cortical material was removed with the irrigation-aspiration handpiece. The capsular bag was inflated with viscoelastic and the [employer]nis ZCB00 21.5-diopter lens, serial number [id number] was placed in the capsular bag without complication. The remaining viscoelastic was removed from the eye with the irrigation-aspiration handpiece. The wounds were hydrated. The anterior chamber was flushed with Miostat and the eye was inflated to physiologic pressure. The wounds were found to be water tight. The eye was dressed with Vigamox and  Omnipred. The patient was given protective glasses to wear throughout the day and a shield with which to sleep tonight. The patient was also given drops with which to begin a drop regimen today and will follow-up with me in one day.  ____________________________ Livingston Diones. Anaisa Radi, MD wlp:slb D: 04/15/2011 12:18:32 ET T: 04/15/2011 12:23:56 ET JOB#: [id number]  cc: Joson Sapp L. Renton Berkley, MD, <Dictator> Livingston Diones Stormie Ventola MD ELECTRONICALLY SIGNED 04/15/2011 12:27

## 2016-01-02 ENCOUNTER — Other Ambulatory Visit: Payer: Self-pay | Admitting: Neurology

## 2016-01-02 DIAGNOSIS — M542 Cervicalgia: Secondary | ICD-10-CM

## 2016-01-02 DIAGNOSIS — G249 Dystonia, unspecified: Secondary | ICD-10-CM

## 2016-01-02 DIAGNOSIS — R413 Other amnesia: Secondary | ICD-10-CM

## 2016-01-16 ENCOUNTER — Ambulatory Visit
Admission: RE | Admit: 2016-01-16 | Discharge: 2016-01-16 | Disposition: A | Discharge status: Home / Self Care | Payer: Medicare HMO | Source: Ambulatory Visit | Attending: Neurology | Admitting: Neurology

## 2016-01-16 DIAGNOSIS — G249 Dystonia, unspecified: Secondary | ICD-10-CM

## 2016-01-16 DIAGNOSIS — M50322 Other cervical disc degeneration at C5-C6 level: Secondary | ICD-10-CM | POA: Diagnosis not present

## 2016-01-16 DIAGNOSIS — R413 Other amnesia: Secondary | ICD-10-CM

## 2016-01-16 DIAGNOSIS — M4802 Spinal stenosis, cervical region: Secondary | ICD-10-CM | POA: Diagnosis not present

## 2016-01-16 DIAGNOSIS — M542 Cervicalgia: Secondary | ICD-10-CM

## 2016-01-16 DIAGNOSIS — R930 Abnormal findings on diagnostic imaging of skull and head, not elsewhere classified: Secondary | ICD-10-CM | POA: Insufficient documentation

## 2016-09-19 ENCOUNTER — Emergency Department
Admission: EM | Admit: 2016-09-19 | Discharge: 2016-09-24 | Disposition: E | Payer: Medicare HMO | Attending: Student in an Organized Health Care Education/Training Program | Admitting: Student in an Organized Health Care Education/Training Program

## 2016-09-19 ENCOUNTER — Encounter: Payer: Self-pay | Admitting: Anesthesiology

## 2016-09-19 ENCOUNTER — Emergency Department: Payer: Medicare HMO

## 2016-09-19 ENCOUNTER — Encounter: Payer: Self-pay | Admitting: Emergency Medicine

## 2016-09-19 DIAGNOSIS — I10 Essential (primary) hypertension: Secondary | ICD-10-CM | POA: Diagnosis not present

## 2016-09-19 DIAGNOSIS — Z79899 Other long term (current) drug therapy: Secondary | ICD-10-CM | POA: Diagnosis not present

## 2016-09-19 DIAGNOSIS — J9601 Acute respiratory failure with hypoxia: Secondary | ICD-10-CM | POA: Diagnosis not present

## 2016-09-19 DIAGNOSIS — I713 Abdominal aortic aneurysm, ruptured, unspecified: Secondary | ICD-10-CM

## 2016-09-19 DIAGNOSIS — R109 Unspecified abdominal pain: Secondary | ICD-10-CM

## 2016-09-19 DIAGNOSIS — R1110 Vomiting, unspecified: Secondary | ICD-10-CM | POA: Diagnosis present

## 2016-09-19 DIAGNOSIS — Z87891 Personal history of nicotine dependence: Secondary | ICD-10-CM | POA: Diagnosis not present

## 2016-09-19 DIAGNOSIS — Z7901 Long term (current) use of anticoagulants: Secondary | ICD-10-CM | POA: Insufficient documentation

## 2016-09-19 DIAGNOSIS — R1031 Right lower quadrant pain: Secondary | ICD-10-CM | POA: Insufficient documentation

## 2016-09-19 LAB — PROTIME-INR
INR: 1.19
PROTHROMBIN TIME: 15.2 s (ref 11.4–15.2)

## 2016-09-19 LAB — CBC
HCT: 35.5 % (ref 35.0–47.0)
Hemoglobin: 12.1 g/dL (ref 12.0–16.0)
MCH: 33.5 pg (ref 26.0–34.0)
MCHC: 34.1 g/dL (ref 32.0–36.0)
MCV: 98.3 fL (ref 80.0–100.0)
Platelets: 212 10*3/uL (ref 150–440)
RBC: 3.61 MIL/uL — ABNORMAL LOW (ref 3.80–5.20)
RDW: 14 % (ref 11.5–14.5)
WBC: 14.2 10*3/uL — AB (ref 3.6–11.0)

## 2016-09-19 LAB — TROPONIN I: TROPONIN I: 0.04 ng/mL — AB (ref <0.03)

## 2016-09-19 LAB — ABO/RH: ABO/RH(D): A POS

## 2016-09-19 LAB — LACTIC ACID, PLASMA: LACTIC ACID, VENOUS: 9.6 mmol/L — AB (ref 0.5–1.9)

## 2016-09-19 MED ORDER — IPRATROPIUM-ALBUTEROL 0.5-2.5 (3) MG/3ML IN SOLN: 3.0000 mL | Freq: Once | RESPIRATORY_TRACT | Status: DC

## 2016-09-19 MED ORDER — ONDANSETRON HCL 4 MG/2ML IJ SOLN
INTRAMUSCULAR | Status: AC
  Administered 2016-09-19: 4 mg
  Filled 2016-09-19: qty 2

## 2016-09-19 MED ORDER — SODIUM CHLORIDE 0.9 % IV BOLUS (SEPSIS)
1000.0000 mL | Freq: Once | INTRAVENOUS | Status: AC
  Administered 2016-09-19: 1000 mL via INTRAVENOUS

## 2016-09-19 MED ORDER — PROTHROMBIN COMPLEX CONC HUMAN 500 UNITS IV KIT: 50.0000 [IU]/kg | PACK | Status: DC

## 2016-09-19 MED ORDER — MORPHINE SULFATE (PF) 4 MG/ML IV SOLN
4.0000 mg | Freq: Once | INTRAVENOUS | Status: AC
  Administered 2016-09-19: 4 mg via INTRAVENOUS
  Filled 2016-09-19: qty 1

## 2016-09-19 MED ORDER — IOPAMIDOL (ISOVUE-370) INJECTION 76%
75.0000 mL | Freq: Once | INTRAVENOUS | Status: AC | PRN
  Administered 2016-09-19: 75 mL via INTRAVENOUS

## 2016-09-19 NOTE — ED Provider Notes (Addendum)
[facility] Emergency Department Provider Note    First MD Initiated Contact with Patient 09/21/2016 2122     (approximate)  I have reviewed the triage vital signs and the nursing notes.   HISTORY  Chief Complaint Emesis    HPI Sandra Duke is a 81 y.o. female chief complaint of nausea vomiting and lower abdominal pain that started today since he was shortness of breath. Patient has not had these symptoms before. sHe is complaining of persistent nausea. States the pain is primarily located in the right lower quadrant area without any radiation.  Patient does endorse dark emesis. Since that she is status post appendectomy as well as cholecystectomy. Also status post hysterectomy. Patient also has a history of COPD but is not on any home oxygen.   Past Medical History:  Diagnosis Date  . Breast screening, unspecified   . Cancer ([place]) 01/1999   right bst, lumpectomy,sn biopay, xray dissection as part of [facility] sentinel node study T1,N0,M0, ER/PR positive HER-2/neu not overexpressing infiltrating ductal carcinoma. 5 yrs of tamoxifen therapy completed in 2006   . Hypertension   . Malignant neoplasm of upper-outer quadrant of female breast ([place])   . Personal history of colonic polyps   . Personal history of malignant neoplasm of breast 2000  . Personal history of tobacco use, presenting hazards to health   . Special screening for malignant neoplasms, colon   . Ventral hernia, unspecified, without mention of obstruction or gangrene 2010   No family history on file. Past Surgical History:  Procedure Laterality Date  . ABDOMINAL AORTIC ANEURYSM REPAIR  2009  . ABDOMINAL HYSTERECTOMY     age of 41  . BREAST LUMPECTOMY Right 2000  . broken hip  2006  . CHOLECYSTECTOMY    . COLONOSCOPY  3235,5732   adenomatous polyp removed; rectal polyp(2008), hyperplastic changes  . EYE SURGERY Bilateral 2013  . OOPHORECTOMY Right 07/19/2008  . PELVIC EXENTERATION  2009   Broken pelvis  . SKIN CANCER EXCISION  2008  . UPPER GI ENDOSCOPY  2003   bx distal esophagus-squamous mucosa with mild reactive changes consistent with gastroesophageal reflux. No columnar mucosa present.  Marland Kitchen VENTRAL HERNIA REPAIR  2010   with onlay prolite mesh  . WRIST SURGERY  2009   left   Patient Active Problem List   Diagnosis Date Noted  . Ventral hernia 07/19/2008  . Personal history of colonic polyps 05/06/2001  . Cancer ([place]) 01/25/1999      Prior to Admission medications   Medication Sig Start Date End Date Taking? Authorizing Provider  ELIQUIS 5 MG TABS tablet Take 5 mg by mouth 2 (two) times daily. 08/25/16  Yes [provider]  alendronate (FOSAMAX) 70 MG tablet Take 70 mg by mouth. Take with a full glass of water on an empty stomach.    [provider]  amLODipine (NORVASC) 5 MG tablet Take 5 mg by mouth daily.    [provider]  cyclobenzaprine (FLEXERIL) 10 MG tablet Take 10 mg by mouth daily.    [provider]  lisinopril (PRINIVIL,ZESTRIL) 20 MG tablet Take 20 mg by mouth daily.    [provider]  metoprolol succinate (TOPROL-XL) 25 MG 24 hr tablet Take 25 mg by mouth daily.    [provider]  Omeprazole (PRILOSEC PO) Take by mouth.    [provider]    Allergies Sulfa antibiotics; Macrobid [nitrofurantoin macrocrystal]; and Naprosyn [naproxen]    Social History Social History  Substance Use Topics  . Smoking status: Former Smoker    Years: 15.00    Types: Cigarettes  . Smokeless tobacco: Never Used     Comment: quit in 2006  . Alcohol use No    Review of Systems Patient denies headaches, rhinorrhea, blurry vision, numbness, shortness of breath, chest pain, edema, cough, abdominal pain, nausea, vomiting, diarrhea, dysuria, fevers, rashes or hallucinations unless otherwise stated above in HPI. ____________________________________________   PHYSICAL EXAM:  VITAL SIGNS: Vitals:    09/21/2016 2256 09/17/2016 2301  BP: (!) 64/40 (!) 110/99  Pulse: 68   Resp:    Temp:      Constitutional: Alert and oriented. ill appearingEyes: Conjunctivae are normal.  Head: Atraumatic. Nose: No congestion/rhinnorhea. Mouth/Throat: Mucous membranes are moist.   Neck: No stridor. Painless ROM.  Cardiovascular: Normal rate, regular rhythm. Grossly normal heart sounds.  Good peripheral circulation. Respiratory:mild tachypnea, left sided wheeze, no rhonchi Gastrointestinal: Soft with mild ttp of rlq. No distention. No abdominal bruits. No CVA tenderness. Musculoskeletal: No lower extremity tenderness nor edema.  No joint effusions. Neurologic:  Normal speech and language. No gross focal neurologic deficits are appreciated. No facial droop Skin:  Skin is warm, dry and intact. No rash noted. Psychiatric: Mood and affect are normal. Speech and behavior are normal.  ____________________________________________   LABS (all labs ordered are listed, but only abnormal results are displayed)  Results for orders placed or performed during the hospital encounter of 09/14/2016 (from the past 24 hour(s))  Comprehensive metabolic panel     Status: Abnormal   Collection Time: 08/31/2016  6:43 PM  Result Value Ref Range   Sodium 135 135 - 145 mmol/L   Potassium 5.3 (H) 3.5 - 5.1 mmol/L   Chloride 97 (L) 101 - 111 mmol/L   CO2 21 (L) 22 - 32 mmol/L   Glucose, Bld 200 (H) 65 - 99 mg/dL   BUN 29 (H) 6 - 20 mg/dL   Creatinine, Ser 2.02 (H) 0.44 - 1.00 mg/dL   Calcium 9.8 8.9 - 10.3 mg/dL   Total Protein 7.4 6.5 - 8.1 g/dL   Albumin 4.2 3.5 - 5.0 g/dL   AST 45 (H) 15 - 41 U/L   ALT 19 14 - 54 U/L   Alkaline Phosphatase 36 (L) 38 - 126 U/L   Total Bilirubin 0.7 0.3 - 1.2 mg/dL   GFR calc non Af Amer 22 (L) >60 mL/min   GFR calc Af Amer 26 (L) >60 mL/min   Anion gap 17 (H) 5 - 15  CBC     Status: Abnormal   Collection Time: 09/13/2016  6:43 PM  Result Value Ref Range   WBC 14.2 (H) 3.6 - 11.0 K/uL    RBC 3.61 (L) 3.80 - 5.20 MIL/uL   Hemoglobin 12.1 12.0 - 16.0 g/dL   HCT 35.5 35.0 - 47.0 %   MCV 98.3 80.0 - 100.0 fL   MCH 33.5 26.0 - 34.0 pg   MCHC 34.1 32.0 - 36.0 g/dL   RDW 14.0 11.5 - 14.5 %   Platelets 212 150 - 440 K/uL  Type and screen [facility]     Status: None (Preliminary result)   Collection Time: 08/24/2016  6:43 PM  Result Value Ref Range   ABO/RH(D) A POS    Antibody Screen NEG    Sample Expiration 09/22/2016    Unit Number [id number]    Blood Component Type RED CELLS,LR    Unit division 00  Status of Unit ISSUED    Transfusion Status OK TO TRANSFUSE    Crossmatch Result COMPATIBLE    Unit Number [id number]    Blood Component Type RED CELLS,LR    Unit division 00    Status of Unit ISSUED    Transfusion Status OK TO TRANSFUSE    Crossmatch Result COMPATIBLE    Unit Number [id number]    Blood Component Type RED CELLS,LR    Unit division 00    Status of Unit REL FROM [facility]    Transfusion Status OK TO TRANSFUSE    Crossmatch Result Compatible    Unit Number [id number]    Blood Component Type RED CELLS,LR    Unit division 00    Status of Unit REL FROM [facility]    Transfusion Status OK TO TRANSFUSE    Crossmatch Result Compatible    Unit Number [id number]    Blood Component Type RED CELLS,LR    Unit division 00    Status of Unit ALLOCATED    Transfusion Status OK TO TRANSFUSE    Crossmatch Result Compatible    Unit Number [id number]    Blood Component Type RED CELLS,LR    Unit division 00    Status of Unit ALLOCATED    Transfusion Status OK TO TRANSFUSE    Crossmatch Result Compatible   Protime-INR     Status: None   Collection Time: 09/12/2016  6:43 PM  Result Value Ref Range   Prothrombin Time 15.2 11.4 - 15.2 seconds   INR 1.19   Lactic acid, plasma     Status: Abnormal   Collection Time: 09/12/2016 10:05 PM  Result Value Ref Range   Lactic Acid, Venous 9.6 (HH) 0.5 - 1.9 mmol/L  Troponin I     Status:  Abnormal   Collection Time: 09/06/2016 10:05 PM  Result Value Ref Range   Troponin I 0.04 (HH) <0.03 ng/mL  Prepare RBC     Status: None   Collection Time: 09/05/2016 10:36 PM  Result Value Ref Range   Order Confirmation ORDER PROCESSED BY BLOOD BANK   ABO/Rh     Status: None   Collection Time: 09/06/2016 10:36 PM  Result Value Ref Range   ABO/RH(D) A POS    ____________________________________________ ____________________________________________  RADIOLOGY  I personally reviewed all radiographic images ordered to evaluate for the above acute complaints and reviewed radiology reports and findings.  These findings were personally discussed with the patient.  Please see medical record for radiology report.  ____________________________________________   PROCEDURES  Procedure(s) performed:  Procedure Name: Intubation Date/Time: 09/03/2016 11:34 PM Performed by: Merlyn Lot Pre-anesthesia Checklist: Patient identified Induction Type: Rapid sequence Laryngoscope Size: Glidescope and 4 Grade View: Grade II Tube size: 7.5 mm Number of attempts: 1 Airway Equipment and Method: Stylet and Video-laryngoscopy Placement Confirmation: ETT inserted through vocal cords under direct vision,  Positive ETCO2,  CO2 detector and Breath sounds checked- equal and bilateral Secured at: 22 cm Tube secured with: Tape Dental Injury: Teeth and Oropharynx as per pre-operative assessment  Difficulty Due To: Difficulty was unanticipated    .Central Line Date/Time: 09/18/2016 11:35 PM Performed by: Merlyn Lot Authorized by: Merlyn Lot   Consent:    Consent obtained:  Emergent situation   Risks discussed:  Incorrect placement, arterial puncture, infection, bleeding, nerve damage and pneumothorax Pre-procedure details:    Hand hygiene: Hand hygiene performed prior to insertion     Sterile barrier technique: All elements of maximal sterile technique followed  Skin preparation:   Hibiclens Procedure details:    Location:  R internal jugular   Patient position:  Flat   Catheter size:  7.5 Fr   Landmarks identified: yes     Ultrasound guidance: yes     Sterile ultrasound techniques: Sterile gel and sterile probe covers were used     Number of attempts:  1   Successful placement: yes   Post-procedure details:    Post-procedure:  Dressing applied and line sutured   Assessment:  Blood return through all ports, no pneumothorax on x-ray, placement verified by x-ray and free fluid flow   Patient tolerance of procedure:  Tolerated well, no immediate complications      Critical Care performed: yes CRITICAL CARE Performed by: Willy Eddy   Total critical care time: 55 minutes  Critical care time was exclusive of separately billable procedures and treating other patients.  Critical care was necessary to treat or prevent imminent or life-threatening deterioration.  Critical care was time spent personally by me on the following activities: development of treatment plan with patient and/or surrogate as well as nursing, discussions with consultants, evaluation of patient's response to treatment, examination of patient, obtaining history from patient or surrogate, ordering and performing treatments and interventions, ordering and review of laboratory studies, ordering and review of radiographic studies, pulse oximetry and re-evaluation of patient's condition.  ____________________________________________   INITIAL IMPRESSION / ASSESSMENT AND PLAN / ED COURSE  Pertinent labs & imaging results that were available during my care of the patient were reviewed by me and considered in my medical decision making (see chart for details).  DDX: AAA< AE fistula, gastirtis, sbo, appy, sepsis, colitis,   Sandra Duke is a 81 y.o. who presents to the ED with above presentation. Patient arrived back to the ER with the complaints above. Patient with significant tenderness to  palpation on abdominal exam therefore with her renal function and insufficient she was taken to CT scan to evaluate for evidence of appendicitis or perforation.  Her presentation that she was normotensive and had a stable hemoglobin without any acute distress and quite frankly was more consistent with dehydration or in her pruritus initially. She did have mild tachypnea which were thought was secondary to propofol COPD with the patient otherwise was nontoxic appearing. CT imaging showed evidence of probable ruptured AAA. After discussion with vascular surgery patient was taken emergently to CT angiogram.  CT angiogram did confirm evidence of aortic aneurysm rupture. The bedside unfortunate patient did become hypotensive requiring resuscitative efforts with multiple units of packed red cells. Patient was intubated as she became agonal. With passive surgery at bedside recognizing her Vascular surgery was at the bedside and given very poor prognosis the family decided to pursue comfort measures only. The patient was disconnected from the ventilator. She is made comfortable with IV pain medication.     ----------------------------------------- 12:09 AM on 09/23/2016 -----------------------------------------  Current bedside. Patient with no respirations. Pulseless. Time of death declared at 1205-07-10.   ____________________________________________   FINAL CLINICAL IMPRESSION(S) / ED DIAGNOSES  Final diagnoses:  Right lower quadrant abdominal pain  AAA (abdominal aortic aneurysm, ruptured) (HCC)  Acute respiratory failure with hypoxia (HCC)      NEW MEDICATIONS STARTED DURING THIS VISIT:  New Prescriptions   No medications on file     Note:  This document was prepared using Dragon voice recognition software and may include unintentional dictation errors.    Willy Eddy, MD 09-23-16 Salley Hews    Roxan Hockey,  Saralyn Pilar, MD Sep 21, 2016 0263

## 2016-09-19 NOTE — ED Triage Notes (Signed)
Vomiting blood x 1 today.  States emesis is black. Also c/o aching all over.

## 2016-09-19 NOTE — ED Notes (Signed)
[name] paged to help comfort family as patient actively dying; [name] offered emotional support and coordinated with patient Sandra Duke who was with family at bedside, for additional spiritual support and prayer. [name]

## 2016-09-19 NOTE — Consult Note (Signed)
[facility] [place]  Patient name: Sandra Duke MRN: [id number] DOB: [birth year] Sex: female   REQUESTING PROVIDER:    ER   REASON FOR CONSULT:    Ruptured AAA  HISTORY OF PRESENT ILLNESS:   Sandra Duke is a 81 y.o. female, who is presented to the ER with hematemesis and pain all over, especially in her lower abdomen that began today.  She has a history of open AAA repair about 8 years ago.  She had a non-contrast CT scanthat was suggestive of a ruptured AAA.  Her anatomy was very unclear and so I requested a CTA to define her anatomy.  The family was aware of the risk of renal failure with the use of IV dye but I needed the CT scan to plan her repair.  After her CT scan,she became agonal and was intubated and given pRBC.  On my arrival, she was hypotensive and tachycardiac.  She has a history of hypertension and smoking  PAST MEDICAL HISTORY    Past Medical History:  Diagnosis Date  . Breast screening, unspecified   . Cancer ([place]) 01/1999   right bst, lumpectomy,sn biopay, xray dissection as part of [facility] sentinel node study T1,N0,M0, ER/PR positive HER-2/neu not overexpressing infiltrating ductal carcinoma. 5 yrs of tamoxifen therapy completed in 2006   . Hypertension   . Malignant neoplasm of upper-outer quadrant of female breast ([place])   . Personal history of colonic polyps   . Personal history of malignant neoplasm of breast 2000  . Personal history of tobacco use, presenting hazards to health   . Special screening for malignant neoplasms, colon   . Ventral hernia, unspecified, without mention of obstruction or gangrene 2010     FAMILY HISTORY   No pertinent family history  SOCIAL HISTORY:   Social History   Social History  . Marital status: Widowed    Spouse name: N/A  . Number of children: N/A  . Years of education: N/A   Occupational History  . Not on file.   Social History Main Topics  .  Smoking status: Former Smoker    Years: 15.00    Types: Cigarettes  . Smokeless tobacco: Never Used     Comment: quit in 2006  . Alcohol use No  . Drug use: No  . Sexual activity: Not on file   Other Topics Concern  . Not on file   Social History Narrative  . No narrative on file    ALLERGIES:    Allergies  Allergen Reactions  . Sulfa Antibiotics Anaphylaxis  . Macrobid [Nitrofurantoin Macrocrystal] Other (See Comments)    burning  . Naprosyn [Naproxen] Other (See Comments)    burning    CURRENT MEDICATIONS:    Current Facility-Administered Medications  Medication Dose Route Frequency Provider Last Rate Last Dose  . ipratropium-albuterol (DUONEB) 0.5-2.5 (3) MG/3ML nebulizer solution 3 mL  3 mL Nebulization Once Merlyn Lot, MD      . prothrombin complex conc human (KCENTRA) IVPB 3,050 Units  50 Units/kg Intravenous STAT Merlyn Lot, MD       Current Outpatient Prescriptions  Medication Sig Dispense Refill  . alendronate (FOSAMAX) 70 MG tablet Take 70 mg by mouth. Take with a full glass of water on an empty stomach.    Marland Kitchen amLODipine (NORVASC) 5 MG tablet Take 5 mg by mouth daily.    . cyclobenzaprine (FLEXERIL) 10 MG tablet Take 10 mg by mouth daily.    Marland Kitchen ELIQUIS 5  MG TABS tablet Take 5 mg by mouth 2 (two) times daily.  0  . lisinopril (PRINIVIL,ZESTRIL) 20 MG tablet Take 20 mg by mouth daily.    . metoprolol succinate (TOPROL-XL) 25 MG 24 hr tablet Take 25 mg by mouth daily.    . Omeprazole (PRILOSEC PO) Take by mouth.      REVIEW OF SYSTEMS:   Unable to obtain due to intubation  PHYSICAL EXAM:   Vitals:   09/11/2016 1840 09/11/2016 2156 09/11/2016 2256 09/06/2016 2301  BP:  (!) 135/110 (!) 64/40 (!) 110/99  Pulse:  77 68   Resp:  18    Temp:      TempSrc:      SpO2:  95% (!) 83%   Weight: 134 lb (60.8 kg)     Height: '5\' 2"'$  (1.575 m)       GENERAL: The patient is a well-nourished female, in no acute distress. The vital signs are documented  above. CARDIAC: There is a regular rate and rhythm.  VASCULAR: tachycardiac PULMONARY: Nonlabored respirations ABDOMEN: distended MUSCULOSKELETAL: There are no major deformities or cyanosis. NEUROLOGIC: No focal weakness or paresthesias are detected. SKIN: mottling PSYCHIATRIC: The patient has a normal affect.  STUDIES:   I have reviewed her CTA which shows a ruptured AAA at the distal aortic anastamosis  ASSESSMENT and PLAN   Ruptured AAA:  This is a non-survivable event, given that she is hypotensive and hypoxic, requiring intubation.  I do not think she will survive an attempt at operative repair.  She is not a candidate for EVAR given the size of her iliac arteries and their tortuosity.  She would likely be on dialysis if she survived repair.  She has a DNR.  The family understands that surgical repair would not give her a meaningful outcome.  She will be made comfort care.  All of family at bedside   Annamarie Major, MD [facility] [phone] Pager [phone]

## 2016-09-19 NOTE — ED Notes (Signed)
Dr Quentin Cornwall at bedside and informed of critical lab values (Lactic and Troponin)

## 2016-09-20 ENCOUNTER — Encounter
Admission: EM | Disposition: E | Payer: Self-pay | Source: Home / Self Care | Attending: Student in an Organized Health Care Education/Training Program

## 2016-09-20 LAB — COMPREHENSIVE METABOLIC PANEL
ALT: 19 U/L (ref 14–54)
AST: 45 U/L — AB (ref 15–41)
Albumin: 4.2 g/dL (ref 3.5–5.0)
Alkaline Phosphatase: 36 U/L — ABNORMAL LOW (ref 38–126)
Anion gap: 17 — ABNORMAL HIGH (ref 5–15)
BILIRUBIN TOTAL: 0.7 mg/dL (ref 0.3–1.2)
BUN: 29 mg/dL — ABNORMAL HIGH (ref 6–20)
CHLORIDE: 97 mmol/L — AB (ref 101–111)
CO2: 21 mmol/L — ABNORMAL LOW (ref 22–32)
CREATININE: 2.02 mg/dL — AB (ref 0.44–1.00)
Calcium: 9.8 mg/dL (ref 8.9–10.3)
GFR calc Af Amer: 26 mL/min — ABNORMAL LOW (ref >60)
GFR, EST NON AFRICAN AMERICAN: 22 mL/min — AB (ref >60)
Glucose, Bld: 200 mg/dL — ABNORMAL HIGH (ref 65–99)
Potassium: 5.3 mmol/L — ABNORMAL HIGH (ref 3.5–5.1)
Sodium: 135 mmol/L (ref 135–145)
TOTAL PROTEIN: 7.4 g/dL (ref 6.5–8.1)

## 2016-09-20 LAB — MASSIVE TRANSFUSION PROTOCOL ORDER (BLOOD BANK NOTIFICATION)

## 2016-09-20 SURGERY — ANEURYSM ABDOMINAL AORTIC REPAIR
Anesthesia: General

## 2016-09-20 SURGICAL SUPPLY — 72 items
APPLIER CLIP 11 MED OPEN (CLIP)
APPLIER CLIP 13 LRG OPEN (CLIP)
APPLIER CLIP 9.375 SM OPEN (CLIP)
BAG COUNTER SPONGE EZ (MISCELLANEOUS) IMPLANT
BAG DECANTER FOR FLEXI CONT (MISCELLANEOUS) IMPLANT
BAG ISOLATATION DRAPE 20X20 ST (DRAPES) IMPLANT
BLADE SURG 15 STRL LF DISP TIS (BLADE) IMPLANT
BLADE SURG 15 STRL SS (BLADE)
BLADE SURG SZ11 CARB STEEL (BLADE) IMPLANT
BOOT SUTURE AID YELLOW STND (SUTURE) IMPLANT
BRUSH SCRUB 4% CHG (MISCELLANEOUS) IMPLANT
CANISTER SUCT 1200ML W/VALVE (MISCELLANEOUS) IMPLANT
CATH TRAY 16F METER LATEX (MISCELLANEOUS) IMPLANT
CLIP APPLIE 11 MED OPEN (CLIP) IMPLANT
CLIP APPLIE 13 LRG OPEN (CLIP) IMPLANT
CLIP APPLIE 9.375 SM OPEN (CLIP) IMPLANT
COUNTER SPONGE BAG EZ (MISCELLANEOUS)
COVER PROBE FLX POLY STRL (MISCELLANEOUS) IMPLANT
DRAPE INCISE IOBAN 66X45 STRL (DRAPES) IMPLANT
DRAPE ISOLATE BAG 20X20 STRL (DRAPES)
DRAPE MAG INST 16X20 L/F (DRAPES) IMPLANT
DRAPE TABLE BACK 80X90 (DRAPES) IMPLANT
DRESSING SURGICEL FIBRLLR 1X2 (HEMOSTASIS) IMPLANT
DRSG SURGICEL FIBRILLAR 1X2 (HEMOSTASIS)
DRSG TEGADERM 6X8 (GAUZE/BANDAGES/DRESSINGS) IMPLANT
DURAPREP 26ML APPLICATOR (WOUND CARE) IMPLANT
ELECT BLADE 6.5 EXT (BLADE) IMPLANT
ELECT CAUTERY BLADE 6.4 (BLADE) IMPLANT
ELECT REM PT RETURN 9FT ADLT (ELECTROSURGICAL)
ELECTRODE REM PT RTRN 9FT ADLT (ELECTROSURGICAL) IMPLANT
GAUZE SPONGE 4X4 12PLY STRL (GAUZE/BANDAGES/DRESSINGS) IMPLANT
GLOVE BIO SURGEON STRL SZ7 (GLOVE) IMPLANT
GLOVE INDICATOR 7.5 STRL GRN (GLOVE) IMPLANT
GLOVE SURG SYN 8.0 (GLOVE) IMPLANT
GOWN STRL REUS W/ TWL LRG LVL3 (GOWN DISPOSABLE) IMPLANT
GOWN STRL REUS W/ TWL XL LVL3 (GOWN DISPOSABLE) IMPLANT
GOWN STRL REUS W/TWL LRG LVL3 (GOWN DISPOSABLE)
GOWN STRL REUS W/TWL XL LVL3 (GOWN DISPOSABLE)
HEMOSTAT SURGICEL 2X3 (HEMOSTASIS) IMPLANT
IV NS 500ML (IV SOLUTION)
IV NS 500ML BAXH (IV SOLUTION) IMPLANT
KIT CATH CVC 3 LUMEN 7FR 8IN (MISCELLANEOUS) IMPLANT
KIT RM TURNOVER STRD PROC AR (KITS) IMPLANT
LABEL OR SOLS (LABEL) IMPLANT
LOOP RED MAXI  1X406MM (MISCELLANEOUS)
LOOP VESSEL MAXI 1X406 RED (MISCELLANEOUS) IMPLANT
NDL SAFETY 18GX1.5 (NEEDLE) IMPLANT
NS IRRIG 500ML POUR BTL (IV SOLUTION) IMPLANT
PACK BASIN MAJOR ARMC (MISCELLANEOUS) IMPLANT
PACK UNIVERSAL (MISCELLANEOUS) IMPLANT
PAD ABD DERMACEA PRESS 5X9 (GAUZE/BANDAGES/DRESSINGS) IMPLANT
SPONGE LAP 18X18 5 PK (GAUZE/BANDAGES/DRESSINGS) IMPLANT
SPONGE LAP 18X36 2PK (MISCELLANEOUS) IMPLANT
STAPLER SKIN PROX 35W (STAPLE) IMPLANT
SUT ETHIBOND 2-0 (SUTURE) IMPLANT
SUT ETHIBOND 3 0 SH 1 (SUTURE) IMPLANT
SUT PDS AB 1 TP1 96 (SUTURE) IMPLANT
SUT PROLENE 3 0 SH DA (SUTURE) IMPLANT
SUT PROLENE 4 0 SH DA (SUTURE) IMPLANT
SUT SILK 0 (SUTURE)
SUT SILK 0 30XBRD TIE 6 (SUTURE) IMPLANT
SUT SILK 2 0 (SUTURE)
SUT SILK 2-0 18XBRD TIE 12 (SUTURE) IMPLANT
SUT SILK 3 0 (SUTURE)
SUT SILK 3-0 18XBRD TIE 12 (SUTURE) IMPLANT
SUT SILK 4 0 (SUTURE)
SUT SILK 4-0 18XBRD TIE 12 (SUTURE) IMPLANT
SUT VIC AB 0 CT1 36 (SUTURE) IMPLANT
SUT VIC AB 3-0 SH 27 (SUTURE)
SUT VIC AB 3-0 SH 27X BRD (SUTURE) IMPLANT
SYR BULB IRRIG 60ML STRL (SYRINGE) IMPLANT
TAPE UMBIL 1/8X18 RADIOPA (MISCELLANEOUS) IMPLANT

## 2016-09-21 LAB — BPAM RBC
BLOOD PRODUCT EXPIRATION DATE: 201808232359
BLOOD PRODUCT EXPIRATION DATE: 201808242359
Blood Product Expiration Date: 201808232359
Blood Product Expiration Date: 201808232359
Blood Product Expiration Date: 201808232359
Blood Product Expiration Date: 201808232359
ISSUE DATE / TIME: 201807272237
ISSUE DATE / TIME: 201807272252
ISSUE DATE / TIME: 201807272320
ISSUE DATE / TIME: 201807282236
UNIT TYPE AND RH: 5100
UNIT TYPE AND RH: 5100
UNIT TYPE AND RH: 5100
Unit Type and Rh: 5100
Unit Type and Rh: 5100
Unit Type and Rh: 5100

## 2016-09-21 LAB — TYPE AND SCREEN
ABO/RH(D): A POS
Antibody Screen: NEGATIVE
UNIT DIVISION: 0
Unit division: 0
Unit division: 0
Unit division: 0
Unit division: 0
Unit division: 0

## 2016-09-21 LAB — PREPARE RBC (CROSSMATCH)

## 2016-09-24 NOTE — ED Notes (Signed)
10mg  Etomidate and 100mg  Rocuronium given IVP at this time per verbal order by Dr Quentin Cornwall.

## 2016-09-24 NOTE — ED Notes (Signed)
Per Dr Quentin Cornwall time of death 12:07am

## 2016-09-24 NOTE — ED Notes (Addendum)
Pt's breathing ceased, no HR detectable on the monitor.

## 2016-09-24 NOTE — ED Notes (Signed)
Dr Quentin Cornwall made aware of pt's condition and immediately to Rm 6 to assess pt.

## 2016-09-24 NOTE — ED Notes (Signed)
100mg  Fentanyl given IVP per verbal order by Dr Quentin Cornwall.

## 2016-09-24 NOTE — ED Notes (Signed)
Pt's family informs Dr Quentin Cornwall that pt would not want to be intubated for life-saving measures. Dr Quentin Cornwall acknowledges that pt's wishes will be respected; pt will remain intubated until additional family members can be notified and given time to arrive.

## 2016-09-24 NOTE — ED Notes (Signed)
Pt note to have rapid deterioration in mental status and LOC. Code Cart brought into room, pads placed on pt.

## 2016-09-24 NOTE — ED Notes (Signed)
RT paged for Rapid Response d/t pt's declining respiratory status. Dr Quentin Cornwall notified at this time and responded immediately to ED Rm 6 to assess pt.

## 2016-09-24 NOTE — ED Notes (Signed)
RT in room at this time with Dr Quentin Cornwall and preparing to intubate.

## 2016-09-24 NOTE — ED Notes (Addendum)
Pt extubated at this time. Pt remains on Zoll for monitoring. This RN and family at bedside.

## 2016-09-24 NOTE — ED Notes (Signed)
Pt becoming unresponsive to verbal stimuli. Agonal respirations observed by this RN, Ena Dawley, RN, and Annie Main, RN. Bag valve mask opened and in place in needed.

## 2016-09-24 DEATH — deceased

## 2016-10-25 DEATH — deceased
